# Patient Record
Sex: Male | Born: 1960 | Race: White | Hispanic: No | Marital: Married | Smoking: Never smoker
Health system: Southern US, Community
[De-identification: ages and names within clinical notes are randomized; demographics above are authoritative.]

## PROBLEM LIST (undated history)

## (undated) DIAGNOSIS — M1990 Unspecified osteoarthritis, unspecified site: Secondary | ICD-10-CM

## (undated) DIAGNOSIS — Z9289 Personal history of other medical treatment: Secondary | ICD-10-CM

## (undated) HISTORY — PX: KNEE SURGERY: SHX244

## (undated) HISTORY — PX: HERNIA REPAIR: SHX51

## (undated) HISTORY — PX: TONSILLECTOMY: SUR1361

---

## 2004-07-24 ENCOUNTER — Ambulatory Visit (HOSPITAL_COMMUNITY): Admission: RE | Admit: 2004-07-24 | Discharge: 2004-07-24 | Payer: Self-pay | Admitting: Orthopedic Surgery

## 2004-07-24 ENCOUNTER — Ambulatory Visit (HOSPITAL_BASED_OUTPATIENT_CLINIC_OR_DEPARTMENT_OTHER): Admission: RE | Admit: 2004-07-24 | Discharge: 2004-07-24 | Payer: Self-pay | Admitting: Orthopedic Surgery

## 2006-02-17 ENCOUNTER — Ambulatory Visit: Payer: Self-pay | Admitting: Specialist

## 2006-07-02 ENCOUNTER — Ambulatory Visit (HOSPITAL_BASED_OUTPATIENT_CLINIC_OR_DEPARTMENT_OTHER): Admission: RE | Admit: 2006-07-02 | Discharge: 2006-07-02 | Payer: Self-pay | Admitting: Orthopedic Surgery

## 2010-04-02 DIAGNOSIS — E559 Vitamin D deficiency, unspecified: Secondary | ICD-10-CM | POA: Insufficient documentation

## 2013-12-14 ENCOUNTER — Emergency Department: Payer: Self-pay | Admitting: Emergency Medicine

## 2013-12-14 LAB — CK TOTAL AND CKMB (NOT AT ARMC)
CK, Total: 98 U/L (ref 35–232)
CK-MB: 1.1 ng/mL (ref 0.5–3.6)

## 2013-12-14 LAB — COMPREHENSIVE METABOLIC PANEL
Albumin: 4.1 g/dL (ref 3.4–5.0)
Alkaline Phosphatase: 67 U/L
Anion Gap: 6 — ABNORMAL LOW (ref 7–16)
Bilirubin,Total: 0.9 mg/dL (ref 0.2–1.0)
Calcium, Total: 9.6 mg/dL (ref 8.5–10.1)
Chloride: 104 mmol/L (ref 98–107)
Co2: 26 mmol/L (ref 21–32)
Glucose: 89 mg/dL (ref 65–99)
Osmolality: 273 (ref 275–301)
Potassium: 4.7 mmol/L (ref 3.5–5.1)
SGOT(AST): 52 U/L — ABNORMAL HIGH (ref 15–37)
Sodium: 136 mmol/L (ref 136–145)
Total Protein: 7.7 g/dL (ref 6.4–8.2)

## 2013-12-14 LAB — CBC
HGB: 15.6 g/dL (ref 13.0–18.0)
MCHC: 34 g/dL (ref 32.0–36.0)
MCV: 85 fL (ref 80–100)
Platelet: 227 10*3/uL (ref 150–440)

## 2013-12-14 LAB — TROPONIN I
Troponin-I: <0.02 ng/mL
Troponin-I: <0.02 ng/mL

## 2013-12-21 ENCOUNTER — Ambulatory Visit: Payer: Self-pay | Admitting: Family Medicine

## 2014-04-25 LAB — LIPID PANEL
CHOLESTEROL: 186 mg/dL (ref 0–200)
HDL: 59 mg/dL (ref 35–70)
LDL CALC: 87 mg/dL
LDl/HDL Ratio: 1.5
Triglycerides: 199 mg/dL — AB (ref 40–160)

## 2014-04-25 LAB — HEPATIC FUNCTION PANEL
ALK PHOS: 58 U/L (ref 25–125)
ALT: 15 U/L (ref 10–40)
AST: 19 U/L (ref 14–40)
BILIRUBIN, TOTAL: 0.7 mg/dL

## 2014-04-25 LAB — BASIC METABOLIC PANEL
BUN: 13 mg/dL (ref 4–21)
CREATININE: 1 mg/dL (ref 0.6–1.3)
GLUCOSE: 102 mg/dL
POTASSIUM: 5 mmol/L (ref 3.4–5.3)
Sodium: 141 mmol/L (ref 137–147)

## 2014-04-25 LAB — CBC AND DIFFERENTIAL
HCT: 42 % (ref 41–53)
HEMOGLOBIN: 14.7 g/dL (ref 13.5–17.5)
Neutrophils Absolute: 6 /uL
Platelets: 218 10*3/uL (ref 150–399)
WBC: 7.9 10*3/mL

## 2014-04-25 LAB — TSH: TSH: 1.63 u[IU]/mL (ref 0.41–5.90)

## 2014-04-25 LAB — PSA: PSA: 0.4

## 2014-07-07 ENCOUNTER — Ambulatory Visit: Payer: Self-pay | Admitting: Unknown Physician Specialty

## 2014-07-10 HISTORY — PX: COLONOSCOPY: SHX174

## 2014-07-11 LAB — PATHOLOGY REPORT

## 2014-12-26 ENCOUNTER — Ambulatory Visit (INDEPENDENT_AMBULATORY_CARE_PROVIDER_SITE_OTHER): Payer: PRIVATE HEALTH INSURANCE | Admitting: Podiatry

## 2014-12-26 ENCOUNTER — Encounter: Payer: Self-pay | Admitting: Podiatry

## 2014-12-26 VITALS — BP 120/86 | HR 66 | Resp 16 | Ht 68.0 in | Wt 195.0 lb

## 2014-12-26 DIAGNOSIS — M79676 Pain in unspecified toe(s): Secondary | ICD-10-CM

## 2014-12-26 DIAGNOSIS — L600 Ingrowing nail: Secondary | ICD-10-CM

## 2014-12-26 MED ORDER — CEPHALEXIN 500 MG PO CAPS: 500.0000 mg | ORAL_CAPSULE | Freq: Three times a day (TID) | ORAL | Status: DC

## 2014-12-26 NOTE — Patient Instructions (Signed)

## 2014-12-26 NOTE — Progress Notes (Signed)
Subjective:    Patient ID: George HammansJohn A Hegstrom Sr., male    DOB: 1961-08-08, 54 y.o.   MRN: [id number]  HPI Comments: 54 year old male presents the office they with complaints of pain along the inside borders of both big toenails. He states that he previously had been treated for toenail fungus which has resolved however as the nails grew out he has noticed ingrown toenails and deformity to the nails. He states that he periodically will trim out the corner of the toe nail which bothers him. Or recently he has started to is increased pain overlying the area. He denies any redness or drainage from the nail sites. Denies any recent injury or trauma to bilateral lower extremities. No other complaints at this time.     Review of Systems  Gastrointestinal:       Urgency  Musculoskeletal:       Joint pain   Skin:       Change in nails  All other systems reviewed and are negative.      Objective:   Physical Exam AAO x3, NAD DP/PT pulses palpable bilaterally, CRT less than 3 seconds Protective sensation intact with Simms Weinstein monofilament, vibratory sensation intact, Achilles tendon reflex intact There is evidence of ingrowing along the medial nail borders of bilateral hallux. There is tenderness to palpation directly over this area. There is no drainage or purulence identified to bilateral hallux nail sites. No surrounding edema, erythema, increase in warmth over left hallux however there is trace erythema overlying the medial border of the right hallux nail. There is no ascending cellulitis identified. The remaining nails without pathology. No open lesions or pre-ulcerative lesions are identified. No pain with calf compression, swelling, warmth, erythema. No overlying edema, erythema, increase in warmth to bilateral lower extremity's. No areas of pinpoint bony tenderness. MMT 5/5, ROM WNL.       Assessment & Plan:  54 year old male with symptomatic bilateral medial hallux ingrown  toenail -Treatment options including conservative and surgical options were discussed the patient including alternatives, risks, complications. -At this time the patient has elected to proceed with chemical matricectomy partial nail avulsions of bilateral medial hallux nail borders. Risks and complications of the procedure were discussed the patient for which she understands and probably can since the procedure. Under sterile conditions a total of 2.5 mL of a one-to-one mixture of 2% lidocaine plain and 0.5% Marcaine plain was infiltrated in a hallux block fashion bilaterally. An additional 2 mL was infiltrated on the left to ensure anesthesia. Once anesthetized the skin was prepped in a sterile fashion. Tourniquets were applied. Next the medial nail borders of bilateral hallux were then sharply excised making sure to remove the entire offending nail border. There was found to be a very significant amount of ingrowing nail on bilateral hallux. No purulence was identified. Once the nail was removed the underlying skin was debrided and the skin was found to be intact. At this time phenol was applied under standard conditions and then copiously irrigated. Silvadene was applied followed by a dry sterile dressing. After application of the dressing the tourniquet was removed and there is found to be an immediate capillary refill time to both digits. Patient tolerated the procedure well without any complications. Post procedure instructions were discussed with the patient for which he verbally understood. Prescribed Keflex. Monitoring clinical signs or symptoms of infection and directed to call the office immediately should any occur or go directly to the emergency room. -Follow-up in one week  for nail check or sooner should any problems arise. In the meantime, encouraged to call the office with any questions, concerns, change in symptoms.

## 2014-12-27 ENCOUNTER — Telehealth: Payer: Self-pay | Admitting: *Deleted

## 2014-12-27 NOTE — Telephone Encounter (Signed)
Pt states he had a procedure and was instructed to soak in Epsom salt, but for how long.  I left a message informing the pt, he would soak for 20 minutes 2 times a day for 4 - 6 weeks or until the area got a dry hard scab, or until directed by the doctor.

## 2015-01-04 ENCOUNTER — Ambulatory Visit (INDEPENDENT_AMBULATORY_CARE_PROVIDER_SITE_OTHER): Payer: PRIVATE HEALTH INSURANCE | Admitting: Podiatry

## 2015-01-04 ENCOUNTER — Encounter: Payer: Self-pay | Admitting: Podiatry

## 2015-01-04 VITALS — BP 120/74 | HR 84 | Resp 16

## 2015-01-04 DIAGNOSIS — L600 Ingrowing nail: Secondary | ICD-10-CM

## 2015-01-04 NOTE — Progress Notes (Signed)
Patient ID: George HammansJohn A Mosqueda Sr., male   DOB: 1961-06-30, 54 y.o.   MRN: [id number]  Subjective: 54 year old male returns the office 9 days status post bilateral medial hallux partial nail avulsions with chemical matricectomy. He states that he's been continuing Epson salt soaks twice a day followed by antibiotic ointment and a Band-Aid at all times. He denies any drainage or any redness/streaking. Denies any systemic complaints such as fevers, chills, nausea, vomiting. He finish course of antibiotics tomorrow. No other complaints at this time in no acute changes since last appointment.  Objective: AAO x3, NAD DP/PT pulses palpable bilaterally, CRT less than 3 seconds Protective sensation intact with Simms Weinstein monofilament, vibratory sensation intact, Achilles tendon reflex intact Bilateral medial hallux status post partial nail avulsions which appear to be healing well this time. Small amount of granulation tissue within the procedure site. There isa thickened tenderness to palpation overlying the area. There is no swelling erythema, edema, drainage/purulence. No ascending cellulitis. No clinical signs of infection at this time. No other areas of tenderness to bilateral lower extremities. The pain with calf compression, swelling, warmth, erythema.  Assessment: 54 year old male 9 days status post bilateral medial hallux partial nail avulsions with chemical matricectomy, healing well  Plan: -Treatment options were discussed including alternatives, risks, complications. -At this time recommended the patient to continue soaking in Epson salt soaks twice a day covering with antibiotic ointment and a Band-Aid during the day. Can leave the area uncovered at night. Continue this until the area has completely healed. Monitor for any signs or symptoms of infection and directed to call the office should any occur. Finish course of antibiotics however he did not at the refills last needed and if he needs the  refills to call the office. -Discussed with the patient that if the area has not healed or symptomatic in 2 weeks to follow-up or sooner should any problems arise. Otherwise follow-up as needed. Encouraged to call the office with any questions, concerns, change in symptoms.

## 2015-01-04 NOTE — Patient Instructions (Signed)

## 2015-07-02 IMAGING — CR DG CHEST 2V
1 series · 2 of 2 positions shown · non-contrast
Comparison: None.

CLINICAL DATA: Chest pain

EXAM:
CHEST  2 VIEW

[Series 1: pa · 0.17mm/px · 2 of 2 slices shown]
[im 1/2]
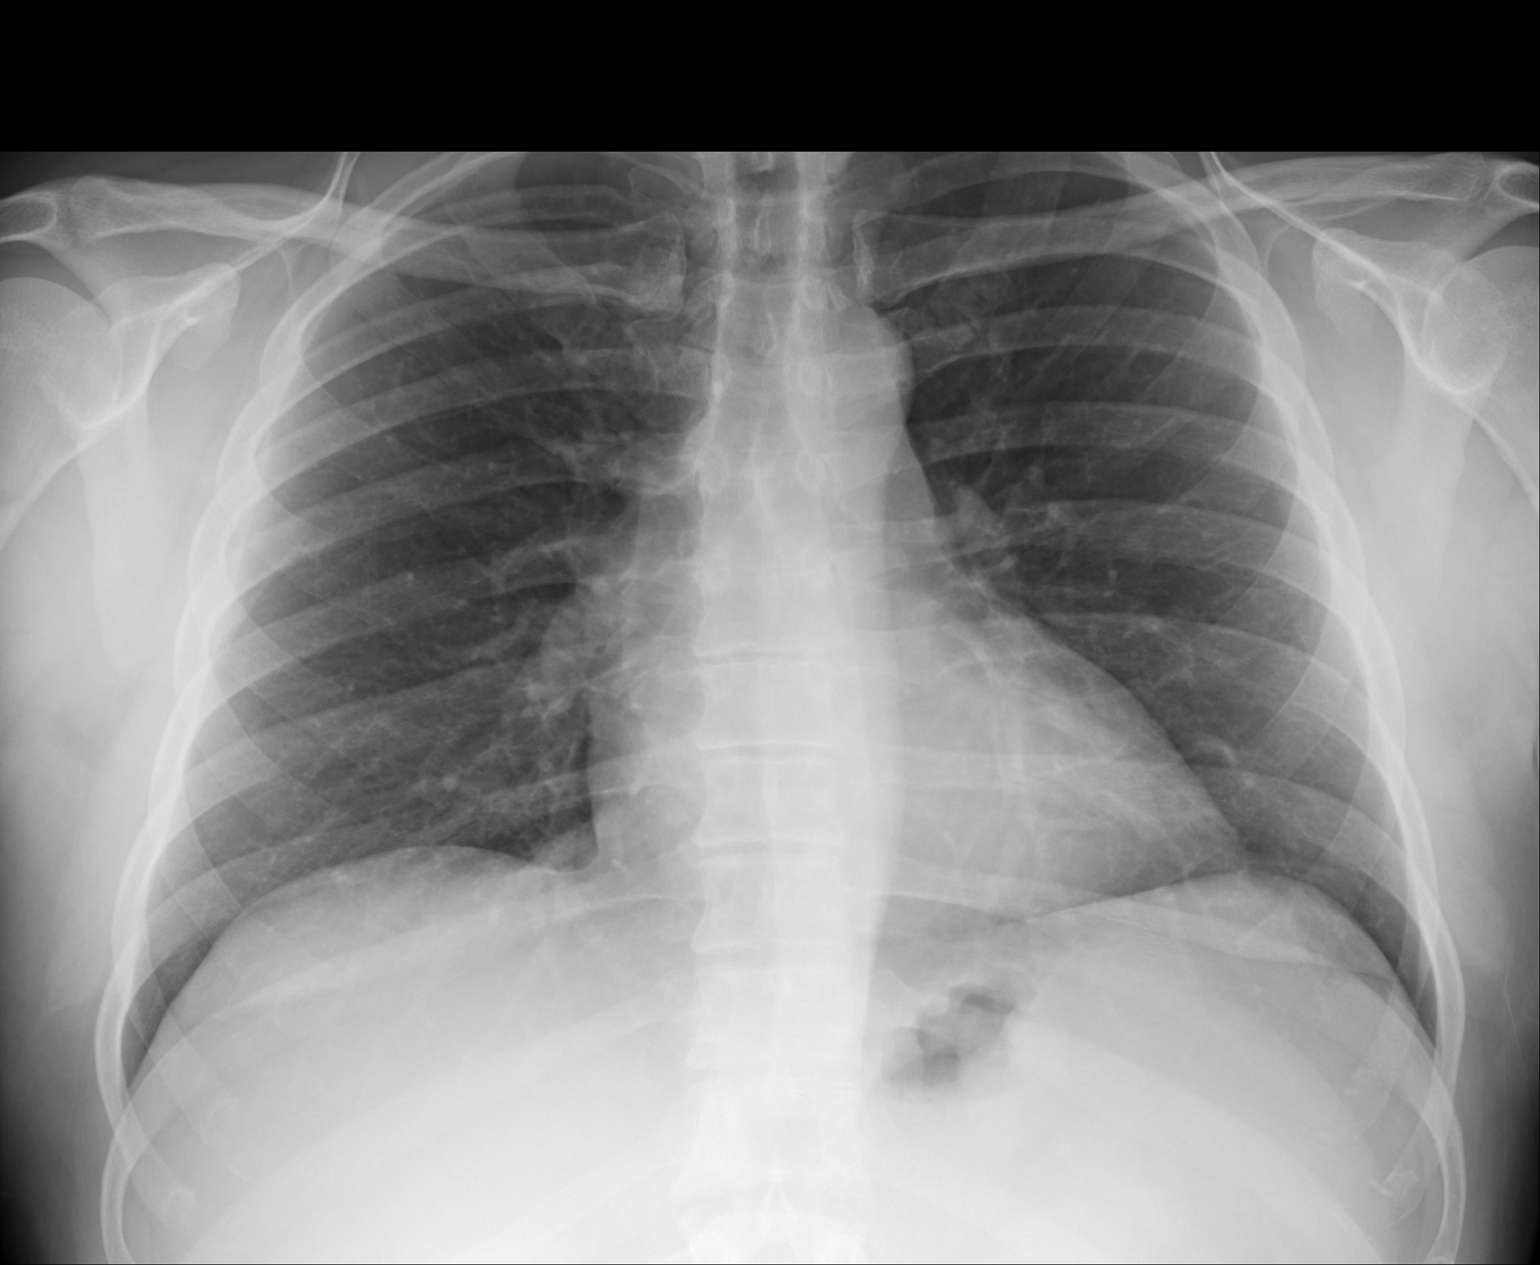
[im 2/2]
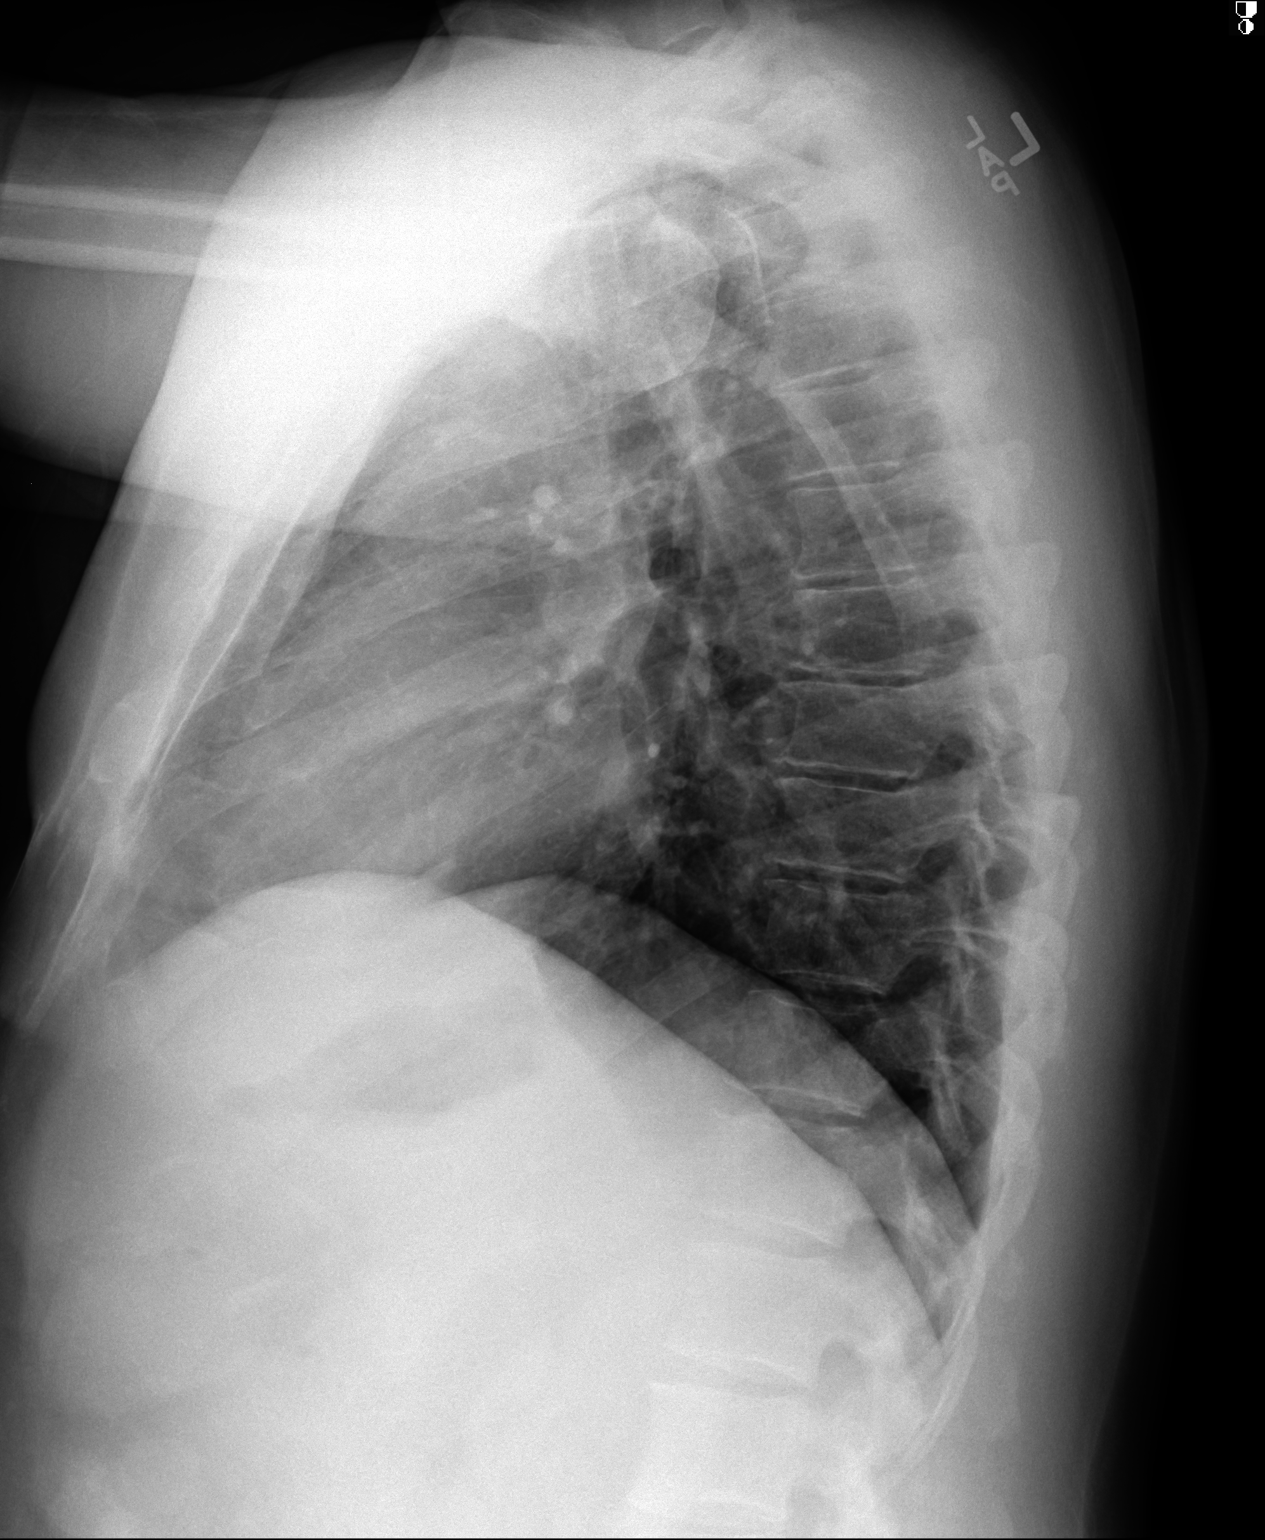

[2 of 2 positions shown; findings below may reference images not displayed]

FINDINGS: The heart size and mediastinal contours are within normal limits.
Both lungs are clear. The visualized skeletal structures are
unremarkable.
IMPRESSION: No active cardiopulmonary disease.

## 2016-06-06 ENCOUNTER — Other Ambulatory Visit: Payer: Self-pay | Admitting: Physician Assistant

## 2016-06-06 NOTE — H&P (Signed)
TOTAL KNEE ADMISSION H&P  Patient is being admitted for left total knee arthroplasty.  Subjective:  Chief Complaint:left knee pain.  HPI: George HammansJohn A Gripp Sr., 55 y.o. male, has a history of pain and functional disability in the left knee due to arthritis and has failed non-surgical conservative treatments for greater than 12 weeks to includeNSAID's and/or analgesics and corticosteriod injections.  Onset of symptoms was gradual, starting 1 years ago with stable course since that time. The patient noted prior procedures on the knee to include  arthroscopy, menisectomy and ACL reconstruction on the left knee(s).  Patient currently rates pain in the left knee(s) at 3 out of 10 with activity. Patient has pain that interferes with activities of daily living and crepitus.  Patient has evidence of subchondral sclerosis and joint space narrowing by imaging studies. There is no active infection.  There are no active problems to display for this patient.  No past medical history on file.  No past surgical history on file.   (Not in a hospital admission) No Known Allergies  Social History  Substance Use Topics  . Smoking status: Never Smoker   . Smokeless tobacco: Not on file  . Alcohol Use: 0.0 oz/week    0 Standard drinks or equivalent per week    No family history on file.   Review of Systems  Constitutional: Negative.   HENT: Negative.   Eyes: Negative.   Respiratory: Negative.   Cardiovascular: Negative.   Gastrointestinal: Negative.   Genitourinary: Negative.   Musculoskeletal: Positive for joint pain.  Skin: Negative.   Neurological: Negative.   Endo/Heme/Allergies: Negative.   Psychiatric/Behavioral: Negative.     Objective:  Physical Exam  Constitutional: He is oriented to person, place, and time. He appears well-developed and well-nourished.  HENT:  Head: Normocephalic and atraumatic.  Eyes: EOM are normal. Pupils are equal, round, and reactive to light.  Neck: Normal range  of motion. Neck supple.  Cardiovascular: Normal rate and regular rhythm.   Respiratory: Effort normal and breath sounds normal.  GI: Soft. Bowel sounds are normal.  Musculoskeletal:  Left knee range of motion 0-95.  Marked patellofemoral crepitus.  Ligaments stable.  Neurological: He is alert and oriented to person, place, and time.  Skin: Skin is warm and dry.  Psychiatric: He has a normal mood and affect. His behavior is normal. Judgment and thought content normal.    Vital signs in last 24 hours: @VSRANGES @  Labs:   Estimated body mass index is 29.66 kg/(m^2) as calculated from the following:   Height as of 12/26/14: 5\' 8"  (1.727 m).   Weight as of 12/26/14: 88.451 kg (195 lb).   Imaging Review Plain radiographs demonstrate severe degenerative joint disease of the left knee(s). The overall alignment ismild varus. The bone quality appears to be fair for age and reported activity level.  Assessment/Plan:  End stage arthritis, left knee   The patient history, physical examination, clinical judgment of the provider and imaging studies are consistent with end stage degenerative joint disease of the left knee(s) and total knee arthroplasty is deemed medically necessary. The treatment options including medical management, injection therapy arthroscopy and arthroplasty were discussed at length. The risks and benefits of total knee arthroplasty were presented and reviewed. The risks due to aseptic loosening, infection, stiffness, patella tracking problems, thromboembolic complications and other imponderables were discussed. The patient acknowledged the explanation, agreed to proceed with the plan and consent was signed. Patient is being admitted for inpatient treatment for surgery, pain  control, PT, OT, prophylactic antibiotics, VTE prophylaxis, progressive ambulation and ADL's and discharge planning. The patient is planning to be discharged home with home health services

## 2016-06-09 ENCOUNTER — Telehealth: Payer: Self-pay

## 2016-06-09 NOTE — Telephone Encounter (Signed)
lmtcb- patient needs appointment for surgical clearance, form on desk [phone]

## 2016-06-10 DIAGNOSIS — K219 Gastro-esophageal reflux disease without esophagitis: Secondary | ICD-10-CM | POA: Insufficient documentation

## 2016-06-10 DIAGNOSIS — L309 Dermatitis, unspecified: Secondary | ICD-10-CM | POA: Insufficient documentation

## 2016-06-10 NOTE — Telephone Encounter (Signed)
Faxed to Dr. Eulah PontMurphy that patient needs to be seen for surgical clearance and that we have not heard from patient back yet-aa

## 2016-06-10 NOTE — Telephone Encounter (Signed)
Pt made appt for tomorrow wed 06/11/16-aa

## 2016-06-11 ENCOUNTER — Encounter: Payer: Self-pay | Admitting: Family Medicine

## 2016-06-11 ENCOUNTER — Ambulatory Visit (INDEPENDENT_AMBULATORY_CARE_PROVIDER_SITE_OTHER): Payer: PRIVATE HEALTH INSURANCE | Admitting: Family Medicine

## 2016-06-11 VITALS — BP 126/78 | HR 62 | Temp 98.1°F | Resp 12 | Ht 67.5 in | Wt 199.2 lb

## 2016-06-11 DIAGNOSIS — E7800 Pure hypercholesterolemia, unspecified: Secondary | ICD-10-CM

## 2016-06-11 DIAGNOSIS — Z01818 Encounter for other preprocedural examination: Secondary | ICD-10-CM

## 2016-06-11 NOTE — Progress Notes (Signed)
Subjective:  HPI  Patient is here for surgical clearance. He is having total left knee replacement on July 12th. Patient has pain in the left knee and has hard time walking. He does not smoke, no illegal drugs. No aspirin use. He has 2 to 3 drinks of alcohol a week. Patient does not exercise due to the knee issue. [facility] labs. We discussed at some length how the pain is affecting his day-to-day life. Prior to Admission medications   Not on File    Patient Active Problem List   Diagnosis Date Noted  . Dermatitis, eczematoid 06/10/2016  . Acid reflux 06/10/2016  . Avitaminosis D 04/02/2010  . Pure hypercholesterolemia 12/21/2009    History reviewed. No pertinent past medical history.  Social History   Social History  . Marital Status: Married    Spouse Name: N/A  . Number of Children: N/A  . Years of Education: N/A   Occupational History  . Not on file.   Social History Main Topics  . Smoking status: Never Smoker   . Smokeless tobacco: Never Used  . Alcohol Use: 0.0 oz/week    0 Standard drinks or equivalent per week     Comment: 2 to 3 drinks a week.  . Drug Use: No  . Sexual Activity: Not on file   Other Topics Concern  . Not on file   Social History Narrative    No Known Allergies  Review of Systems  Constitutional: Negative.   Eyes: Negative.   Respiratory: Negative.   Cardiovascular: Negative.   Gastrointestinal: Negative.   Musculoskeletal: Positive for joint pain.  Skin: Negative.   Neurological: Negative.   Psychiatric/Behavioral: Negative.     Immunization History  Administered Date(s) Administered  . Tdap 03/02/2006   Objective:  There were no vitals taken for this visit.  Physical Exam  Constitutional: He is oriented to person, place, and time and well-developed, well-nourished, and in no distress.  HENT:  Head: Normocephalic and atraumatic.  Right Ear: External ear  normal.  Left Ear: External ear normal.  Nose: Nose normal.  Eyes: Conjunctivae are normal.  Cardiovascular: Normal rate, regular rhythm and normal heart sounds.   Pulmonary/Chest: Effort normal and breath sounds normal.  Abdominal: Soft.  Neurological: He is alert and oriented to person, place, and time.  Skin: Skin is warm and dry.  Psychiatric: Mood, memory, affect and judgment normal.    Lab Results  Component Value Date   WBC 7.9 04/25/2014   HGB 14.7 04/25/2014   HCT 42 04/25/2014   PLT 218 04/25/2014   GLUCOSE 89 12/14/2013   CHOL 186 04/25/2014   TRIG 199* 04/25/2014   HDL 59 04/25/2014   LDLCALC 87 04/25/2014   TSH 1.63 04/25/2014   PSA 0.4 04/25/2014    CMP     Component Value Date/Time   NA 141 04/25/2014   NA 136 12/14/2013 1212   K 5.0 04/25/2014   K 4.7 12/14/2013 1212   CL 104 12/14/2013 1212   CO2 26 12/14/2013 1212   GLUCOSE 89 12/14/2013 1212   BUN 13 04/25/2014   BUN 17 12/14/2013 1212   CREATININE 1.0 04/25/2014   CREATININE 0.77 12/14/2013 1212   CALCIUM 9.6 12/14/2013 1212   PROT 7.7 12/14/2013 1212   ALBUMIN 4.1 12/14/2013 1212   AST 19 04/25/2014   AST 52* 12/14/2013 1212   ALT 15 04/25/2014   ALT 31 12/14/2013  1212   ALKPHOS 58 04/25/2014   ALKPHOS 67 12/14/2013 1212   BILITOT 0.9 12/14/2013 1212   GFRNONAA >60 12/14/2013 1212   GFRAA >60 12/14/2013 1212    Assessment and Plan :  1. Pre-op exam Patient medically stable for new replacement. He is systems essentially negative except for the knee pain which is having him from doing any activities. - EKG 12-Lead Patient medically clear for left TKR. 2. Pure hypercholesterolemia 3.severe OA left knee More than 50% of this visit is spent in counseling  Julieanne Mansonichard Gilbert MD [facility] 06/11/2016 3:25 PM

## 2016-06-12 ENCOUNTER — Encounter (HOSPITAL_COMMUNITY): Payer: Self-pay

## 2016-06-12 ENCOUNTER — Encounter (HOSPITAL_COMMUNITY)
Admission: RE | Admit: 2016-06-12 | Discharge: 2016-06-12 | Disposition: A | Discharge status: Home / Self Care | Payer: No Typology Code available for payment source | Source: Ambulatory Visit | Attending: Orthopedic Surgery | Admitting: Orthopedic Surgery

## 2016-06-12 DIAGNOSIS — Z01812 Encounter for preprocedural laboratory examination: Secondary | ICD-10-CM | POA: Insufficient documentation

## 2016-06-12 DIAGNOSIS — Z0183 Encounter for blood typing: Secondary | ICD-10-CM | POA: Insufficient documentation

## 2016-06-12 DIAGNOSIS — M1712 Unilateral primary osteoarthritis, left knee: Secondary | ICD-10-CM | POA: Diagnosis not present

## 2016-06-12 DIAGNOSIS — Z01818 Encounter for other preprocedural examination: Secondary | ICD-10-CM | POA: Diagnosis not present

## 2016-06-12 HISTORY — DX: Personal history of other medical treatment: Z92.89

## 2016-06-12 HISTORY — DX: Unspecified osteoarthritis, unspecified site: M19.90

## 2016-06-12 LAB — CBC
HCT: 46.7 % (ref 39.0–52.0)
HEMOGLOBIN: 15.9 g/dL (ref 13.0–17.0)
MCH: 29.2 pg (ref 26.0–34.0)
MCHC: 34 g/dL (ref 30.0–36.0)
MCV: 85.8 fL (ref 78.0–100.0)
PLATELETS: 242 10*3/uL (ref 150–400)
RBC: 5.44 MIL/uL (ref 4.22–5.81)
RDW: 13.1 % (ref 11.5–15.5)
WBC: 9.4 10*3/uL (ref 4.0–10.5)

## 2016-06-12 LAB — BASIC METABOLIC PANEL
ANION GAP: 6 (ref 5–15)
BUN: 14 mg/dL (ref 6–20)
CALCIUM: 10 mg/dL (ref 8.9–10.3)
CO2: 26 mmol/L (ref 22–32)
CREATININE: 0.95 mg/dL (ref 0.61–1.24)
Chloride: 107 mmol/L (ref 101–111)
GFR calc Af Amer: >60 mL/min (ref >60)
GLUCOSE: 100 mg/dL — AB (ref 65–99)
Potassium: 4.5 mmol/L (ref 3.5–5.1)
Sodium: 139 mmol/L (ref 135–145)

## 2016-06-12 LAB — SURGICAL PCR SCREEN
MRSA, PCR: NEGATIVE
STAPHYLOCOCCUS AUREUS: NEGATIVE

## 2016-06-12 LAB — ABO/RH: ABO/RH(D): O POS

## 2016-06-12 LAB — TYPE AND SCREEN
ABO/RH(D): O POS
ANTIBODY SCREEN: NEGATIVE

## 2016-06-12 NOTE — Progress Notes (Signed)
Pt. Seen by PCP yesterday, cleared for surgery. EKG done at that visit.  Pt. C/o shoulder pain that was not normal for him a yr. plus ago, [facility], request for that document [facility].

## 2016-06-12 NOTE — Pre-Procedure Instructions (Signed)
Judeen HammansJohn A Garn Sr.  06/12/2016      [place] - , [place] - [id number] [facility] [address] [place] [address] Phone: [phone] Fax: [phone]    Your procedure is scheduled on 06/25/2016.  [facility] Ad[facility] at 1:30 P.M.  Call this number if you have problems the morning of surgery:  [phone]   Remember:  Do not eat food or drink liquids after midnight.  On TUESDAY   Take these medicines the morning of surgery with A SIP OF WATER : NOTHING   Do not wear jewelry, make-up or nail polish.   Do not wear lotions, powders, or perfumes.  You may wear deoderant.    Men may shave face and neck.   Do not bring valuables to the hospital.   [facility] is not responsible for any belongings or valuables.  Contacts, dentures or bridgework may not be worn into surgery.  Leave your suitcase in the car.  After surgery it may be brought to your room.  For patients admitted to the hospital, discharge time will be determined by your treatment team.  Patients discharged the day of surgery will not be allowed to drive home.   Name and phone number of your driver:   With wife  Special instructions:  Special Instructions: [place] - Preparing for Surgery  Before surgery, you can play an important role.  Because skin is not sterile, your skin needs to be as free of germs as possible.  You can reduce the number of germs on you skin by washing with CHG (chlorahexidine gluconate) soap before surgery.  CHG is an antiseptic cleaner which kills germs and bonds with the skin to continue killing germs even after washing.  Please DO NOT use if you have an allergy to CHG or antibacterial soaps.  If your skin becomes reddened/irritated stop using the CHG and inform your nurse when you arrive at Short Stay.  Do not shave (including legs and underarms) for at least 48 hours prior to the first CHG shower.  You may shave your  face.  Please follow these instructions carefully:   1.  Shower with CHG Soap the night before surgery and the  morning of Surgery.  2.  If you choose to wash your hair, wash your hair first as usual with your  normal shampoo.  3.  After you shampoo, rinse your hair and body thoroughly to remove the  Shampoo.  4.  Use CHG as you would any other liquid soap.  You can apply chg directly to the skin and wash gently with scrungie or a clean washcloth.  5.  Apply the CHG Soap to your body ONLY FROM THE NECK DOWN.    Do not use on open wounds or open sores.  Avoid contact with your eyes, ears, mouth and genitals (private parts).  Wash genitals (private parts)   with your normal soap.  6.  Wash thoroughly, paying special attention to the area where your surgery will be performed.  7.  Thoroughly rinse your body with warm water from the neck down.  8.  DO NOT shower/wash with your normal soap after using and rinsing off   the CHG Soap.  9.  Pat yourself dry with a clean towel.            10.  Wear clean pajamas.            11.  Place  clean sheets on your bed the night of your first shower and do not sleep with pets.  Day of Surgery  Do not apply any lotions/deodorants the morning of surgery.  Please wear clean clothes to the hospital/surgery center.  Please read over the following fact sheets that you were given. Pain Booklet, Coughing and Deep Breathing, Total Joint Packet, MRSA Information and Surgical Site Infection Prevention

## 2016-06-13 ENCOUNTER — Encounter (HOSPITAL_COMMUNITY): Payer: Self-pay | Admitting: Emergency Medicine

## 2016-06-13 NOTE — Progress Notes (Addendum)
Anesthesia Chart Review:  Pt is a [age] year old male scheduled for L total knee arthroplasty on 06/25/2016 with Mckinley Jewelaniel Murphy, MD.   PCP is Julieanne Mansonichard Gilbert, MD who has cleared pt for surgery.   PMH includes:  Arthritis. Never smoker. BMI 31  Preoperative labs reviewed.    EKG 06/11/16: Sinus  Rhythm. Low voltage in precordial leads. Poor R-wave progression -may be secondary to pulmonary disease, consider old anterior infarct.   Stress echo 12/16/13 [facility] Potters([facility]):  1. Normal treadmill EKG without evidence of ischemia or dysrhythmia 2. Excellent exercise tolerance for age 89. Normal stress echocardiographic images without evidence of ischemia  If no changes, I anticipate pt can proceed with surgery as scheduled.   Rica Mastngela Dejia Ebron, FNP-BC [facility]/Anesthesiology Phone: [phone] 06/16/2016 2:32 PM

## 2016-06-25 ENCOUNTER — Encounter (HOSPITAL_COMMUNITY): Admission: RE | Payer: Self-pay | Source: Ambulatory Visit

## 2016-06-25 ENCOUNTER — Inpatient Hospital Stay (HOSPITAL_COMMUNITY)
Admission: RE | Admit: 2016-06-25 | Payer: No Typology Code available for payment source | Source: Ambulatory Visit | Admitting: Orthopedic Surgery

## 2016-06-25 SURGERY — ARTHROPLASTY, KNEE, TOTAL
Anesthesia: General | Site: Knee | Laterality: Left

## 2017-12-24 LAB — HM COLONOSCOPY

## 2018-01-05 ENCOUNTER — Encounter: Payer: Self-pay | Admitting: Unknown Physician Specialty

## 2018-03-17 ENCOUNTER — Other Ambulatory Visit: Payer: Self-pay

## 2018-03-17 ENCOUNTER — Ambulatory Visit: Payer: No Typology Code available for payment source | Admitting: Family Medicine

## 2018-03-17 VITALS — BP 122/70 | HR 90 | Temp 98.0°F | Resp 16 | Wt 200.0 lb

## 2018-03-17 DIAGNOSIS — J329 Chronic sinusitis, unspecified: Secondary | ICD-10-CM

## 2018-03-17 DIAGNOSIS — J42 Unspecified chronic bronchitis: Secondary | ICD-10-CM | POA: Diagnosis not present

## 2018-03-17 MED ORDER — AMOXICILLIN-POT CLAVULANATE 875-125 MG PO TABS: 1.0000 | ORAL_TABLET | Freq: Three times a day (TID) | ORAL | 0 refills | Status: DC

## 2018-03-17 NOTE — Progress Notes (Signed)
George HammansJohn A Toohey Sr.  MRN: [id number] DOB: 04-Feb-1961  Subjective:  HPI   The patient is a 57 year old male who presents for evaluation of cough and congestion.  He states he started having chills about 1 week ago and then it turned into laryngitis with chest congestion and cough productive of greenish sputum and wheezing at night.   He has been using Mucinex DM and states he is now producing more mucus that he is able to move.   He also states that he had been at a meeting last week and was around a lot of people that were sick and then had to fly to [place] an outdoor [place] in the [place].   He is not being kept up from the cough but is getting up at night when he wakes and goes into the bathroom and tries to get as much congestion out as he can.  Patient Active Problem List   Diagnosis Date Noted  . Dermatitis, eczematoid 06/10/2016  . Acid reflux 06/10/2016  . Avitaminosis D 04/02/2010  . Pure hypercholesterolemia 12/21/2009    Past Medical History:  Diagnosis Date  . Arthritis    L knee, has had treatment for steroids, for lumbar disc problem- 2016   . H/O exercise stress test 2014-   normal , [facility]    Social History   Socioeconomic History  . Marital status: Married    Spouse name: Not on file  . Number of children: Not on file  . Years of education: Not on file  . Highest education level: Not on file  Occupational History  . Not on file  Social Needs  . Financial resource strain: Not on file  . Food insecurity:    Worry: Not on file    Inability: Not on file  . Transportation needs:    Medical: Not on file    Non-medical: Not on file  Tobacco Use  . Smoking status: Never Smoker  . Smokeless tobacco: Never Used  Substance and Sexual Activity  . Alcohol use: Yes    Alcohol/week: 0.0 oz    Comment: 2 to 3 drinks a week.  . Drug use: No  . Sexual activity: Not on file  Lifestyle  . Physical activity:    Days per week: Not on file   Minutes per session: Not on file  . Stress: Not on file  Relationships  . Social connections:    Talks on phone: Not on file    Gets together: Not on file    Attends religious service: Not on file    Active member of club or organization: Not on file    Attends meetings of clubs or organizations: Not on file    Relationship status: Not on file  . Intimate partner violence:    Fear of current or ex partner: Not on file    Emotionally abused: Not on file    Physically abused: Not on file    Forced sexual activity: Not on file  Other Topics Concern  . Not on file  Social History Narrative  . Not on file    Outpatient Encounter Medications as of 03/17/2018  Medication Sig  . ibuprofen (ADVIL,MOTRIN) 200 MG tablet Take 800 mg by mouth every 6 (six) hours as needed.   No facility-administered encounter medications on file as of 03/17/2018.     No Known Allergies  Review of Systems  Constitutional: Positive for chills and malaise/fatigue. Negative for fever.  HENT: Positive for congestion. Negative for ear discharge, ear pain, hearing loss, nosebleeds (some blood in his head congestion that he thinks is from PND), sinus pain, sore throat and tinnitus.   Eyes: Negative for blurred vision, double vision, photophobia, pain, discharge and redness.  Respiratory: Positive for cough, sputum production (greenish) and wheezing. Negative for shortness of breath.   Cardiovascular: Negative for chest pain, palpitations, orthopnea, claudication and leg swelling.  Gastrointestinal: Negative.   Skin: Negative.   Endo/Heme/Allergies: Negative.   Psychiatric/Behavioral: Negative.     Objective:  BP 122/70 (BP Location: Right Arm, Patient Position: Sitting, Cuff Size: Normal)   Pulse 90   Temp 98 F (36.7 C) (Oral)   Resp 16   Wt 200 lb (90.7 kg)   BMI 31.09 kg/m   Physical Exam  Constitutional: He is oriented to person, place, and time and well-developed, well-nourished, and in no distress.    HENT:  Head: Normocephalic and atraumatic.  Right Ear: External ear normal.  Left Ear: External ear normal.  Nose: Nose normal.  Mouth/Throat: Oropharynx is clear and moist.  Eyes: Conjunctivae are normal. No scleral icterus.  Neck: No thyromegaly present.  Cardiovascular: Normal rate, regular rhythm and normal heart sounds.  Pulmonary/Chest: Effort normal and breath sounds normal.  Neurological: He is alert and oriented to person, place, and time. Gait normal. GCS score is 15.  Skin: Skin is warm and dry.  Psychiatric: Mood, memory, affect and judgment normal.    Assessment and Plan :  1. Chronic bronchitis, unspecified chronic bronchitis type (HCC)  - amoxicillin-clavulanate (AUGMENTIN) 875-125 MG tablet; Take 1 tablet by mouth 3 (three) times daily.  Dispense: 21 tablet; Refill: 0  2. Sinusitis, unspecified chronicity, unspecified location  - amoxicillin-clavulanate (AUGMENTIN) 875-125 MG tablet; Take 1 tablet by mouth 3 (three) times daily.  Dispense: 21 tablet; Refill: 0  CPE this summer.  I have done the exam and reviewed the chart and it is accurate to the best of my knowledge. Dentist has been used and  any errors in dictation or transcription are unintentional. Julieanne Manson M.D. [facility]

## 2018-07-08 ENCOUNTER — Ambulatory Visit (INDEPENDENT_AMBULATORY_CARE_PROVIDER_SITE_OTHER): Payer: No Typology Code available for payment source | Admitting: Family Medicine

## 2018-07-08 VITALS — BP 122/78 | HR 84 | Temp 97.9°F | Resp 16 | Ht 67.5 in | Wt 200.0 lb

## 2018-07-08 DIAGNOSIS — Z125 Encounter for screening for malignant neoplasm of prostate: Secondary | ICD-10-CM

## 2018-07-08 DIAGNOSIS — H16139 Photokeratitis, unspecified eye: Secondary | ICD-10-CM

## 2018-07-08 DIAGNOSIS — Z0000 Encounter for general adult medical examination without abnormal findings: Secondary | ICD-10-CM | POA: Diagnosis not present

## 2018-07-08 LAB — POCT URINALYSIS DIPSTICK
Bilirubin, UA: NEGATIVE
GLUCOSE UA: NEGATIVE
Ketones, UA: NEGATIVE
LEUKOCYTES UA: NEGATIVE
Nitrite, UA: NEGATIVE
Protein, UA: NEGATIVE
RBC UA: NEGATIVE
Spec Grav, UA: 1.02 (ref 1.010–1.025)
Urobilinogen, UA: 0.2 E.U./dL
pH, UA: 6 (ref 5.0–8.0)

## 2018-07-08 NOTE — Progress Notes (Signed)
Patient: George Bhakta., Male    DOB: [birth year], 57 y.o.   MRN: [id number] Visit Date: 07/08/2018  Today's Provider: Megan Mans, MD   Chief Complaint  Patient presents with  . Annual Exam   Subjective:  George Logan Sr. is a 57 y.o. male who presents today for health maintenance and complete physical. He feels well. He reports exercising weekly. He reports he is sleeping well.He is married,the father of 3 sons and has an 38 month old granddaughter.  12/24/17 Colonoscopy  Review of Systems  Constitutional: Negative.   HENT: Negative.   Eyes: Negative.   Respiratory: Negative.   Cardiovascular: Negative.   Gastrointestinal: Negative.   Endocrine: Negative.   Genitourinary: Negative.   Musculoskeletal: Negative.   Skin: Negative.   Allergic/Immunologic: Negative.   Neurological: Negative.   Hematological: Negative.   Psychiatric/Behavioral: Negative.     Social History   Socioeconomic History  . Marital status: Married    Spouse name: Not on file  . Number of children: Not on file  . Years of education: Not on file  . Highest education level: Not on file  Occupational History  . Not on file  Social Needs  . Financial resource strain: Not on file  . Food insecurity:    Worry: Not on file    Inability: Not on file  . Transportation needs:    Medical: Not on file    Non-medical: Not on file  Tobacco Use  . Smoking status: Never Smoker  . Smokeless tobacco: Never Used  Substance and Sexual Activity  . Alcohol use: Yes    Alcohol/week: 0.0 oz    Comment: 2 to 3 drinks a week.  . Drug use: No  . Sexual activity: Not on file  Lifestyle  . Physical activity:    Days per week: Not on file    Minutes per session: Not on file  . Stress: Not on file  Relationships  . Social connections:    Talks on phone: Not on file    Gets together: Not on file    Attends religious service: Not on file    Active member of club or organization: Not on file    Attends  meetings of clubs or organizations: Not on file    Relationship status: Not on file  . Intimate partner violence:    Fear of current or ex partner: Not on file    Emotionally abused: Not on file    Physically abused: Not on file    Forced sexual activity: Not on file  Other Topics Concern  . Not on file  Social History Narrative  . Not on file    Patient Active Problem List   Diagnosis Date Noted  . Dermatitis, eczematoid 06/10/2016  . Acid reflux 06/10/2016  . Avitaminosis D 04/02/2010  . Pure hypercholesterolemia 12/21/2009    Past Surgical History:  Procedure Laterality Date  . COLONOSCOPY  07/10/14   tubular adenoma and hyperplastic polyps repeat in 3 years  . HERNIA REPAIR     age 66, L inguinal   . KNEE SURGERY Left   . TONSILLECTOMY      His family history includes Diabetes in his father; Hyperlipidemia in his father; Hypertension in his brother, father, and mother; Stroke in his father.     Outpatient Encounter Medications as of 07/08/2018  Medication Sig  . ibuprofen (ADVIL,MOTRIN) 200 MG tablet Take 800 mg by mouth every 6 (six) hours as needed.  . [  DISCONTINUED] amoxicillin-clavulanate (AUGMENTIN) 875-125 MG tablet Take 1 tablet by mouth 3 (three) times daily.   No facility-administered encounter medications on file as of 07/08/2018.     Patient Care Team: Maple HudsonGilbert, Richard L Jr., MD as PCP - General (Family Medicine)      Objective:   Vitals:  Vitals:   07/08/18 0837  BP: 122/78  Pulse: 84  Resp: 16  Temp: 97.9 F (36.6 C)  TempSrc: Oral  SpO2: 96%  Weight: 200 lb (90.7 kg)  Height: 5' 7.5" (1.715 m)    Physical Exam  Constitutional: He is oriented to person, place, and time. He appears well-developed and well-nourished.  HENT:  Head: Normocephalic and atraumatic.  Right Ear: External ear normal.  Left Ear: External ear normal.  Nose: Nose normal.  Mouth/Throat: Oropharynx is clear and moist.  Eyes: Pupils are equal, round, and reactive to  light. Conjunctivae and EOM are normal.  Neck: Normal range of motion. Neck supple.  Cardiovascular: Normal rate, regular rhythm, normal heart sounds and intact distal pulses.  Pulmonary/Chest: Effort normal and breath sounds normal.  Abdominal: Soft. Bowel sounds are normal.  Genitourinary: Rectum normal, prostate normal and penis normal.  Musculoskeletal: Normal range of motion.  Neurological: He is oriented to person, place, and time.  Skin: Skin is warm and dry.  Psychiatric: He has a normal mood and affect. His behavior is normal. Judgment and thought content normal.     Depression Screen PHQ 2/9 Scores 07/08/2018 03/17/2018 06/11/2016  PHQ - 2 Score 0 0 0      Assessment & Plan:     Routine Health Maintenance and Physical Exam  Exercise Activities and Dietary recommendations Goals    None      Immunization History  Administered Date(s) Administered  . Tdap 03/02/2006    Health Maintenance  Topic Date Due  . HIV Screening  11/01/1976  . TETANUS/TDAP  03/02/2016  . INFLUENZA VACCINE  07/15/2018  . COLONOSCOPY  12/24/2020  . Hepatitis C Screening  Completed     Discussed health benefits of physical activity, and encouraged him to engage in regular exercise appropriate for his age and condition.  AKs Refer to dermatology.   I have done the exam and reviewed the chart and it is accurate to the best of my knowledge. DentistDragon  technology has been used and  any errors in dictation or transcription are unintentional. Julieanne Mansonichard Gilbert M.D. [facility]

## 2018-07-09 LAB — COMPREHENSIVE METABOLIC PANEL
A/G RATIO: 2 (ref 1.2–2.2)
ALBUMIN: 4.7 g/dL (ref 3.5–5.5)
ALT: 15 IU/L (ref 0–44)
AST: 19 IU/L (ref 0–40)
Alkaline Phosphatase: 64 IU/L (ref 39–117)
BILIRUBIN TOTAL: 0.8 mg/dL (ref 0.0–1.2)
BUN / CREAT RATIO: 15 (ref 9–20)
BUN: 14 mg/dL (ref 6–24)
CHLORIDE: 101 mmol/L (ref 96–106)
CO2: 23 mmol/L (ref 20–29)
Calcium: 9.7 mg/dL (ref 8.7–10.2)
Creatinine, Ser: 0.96 mg/dL (ref 0.76–1.27)
GFR calc non Af Amer: 88 mL/min/{1.73_m2} (ref >59)
GFR, EST AFRICAN AMERICAN: 102 mL/min/{1.73_m2} (ref >59)
GLOBULIN, TOTAL: 2.4 g/dL (ref 1.5–4.5)
Glucose: 111 mg/dL — ABNORMAL HIGH (ref 65–99)
POTASSIUM: 4.4 mmol/L (ref 3.5–5.2)
Sodium: 139 mmol/L (ref 134–144)
TOTAL PROTEIN: 7.1 g/dL (ref 6.0–8.5)

## 2018-07-09 LAB — CBC WITH DIFFERENTIAL/PLATELET
Basophils Absolute: 0.1 10*3/uL (ref 0.0–0.2)
Basos: 1 %
EOS (ABSOLUTE): 0.1 10*3/uL (ref 0.0–0.4)
Eos: 2 %
HEMOGLOBIN: 15.7 g/dL (ref 13.0–17.7)
Hematocrit: 46.7 % (ref 37.5–51.0)
IMMATURE GRANS (ABS): 0.1 10*3/uL (ref 0.0–0.1)
Immature Granulocytes: 1 %
LYMPHS: 15 %
Lymphocytes Absolute: 1 10*3/uL (ref 0.7–3.1)
MCH: 29.2 pg (ref 26.6–33.0)
MCHC: 33.6 g/dL (ref 31.5–35.7)
MCV: 87 fL (ref 79–97)
Monocytes Absolute: 0.6 10*3/uL (ref 0.1–0.9)
Monocytes: 9 %
NEUTROS ABS: 5.2 10*3/uL (ref 1.4–7.0)
Neutrophils: 72 %
Platelets: 252 10*3/uL (ref 150–450)
RBC: 5.37 x10E6/uL (ref 4.14–5.80)
RDW: 13.1 % (ref 12.3–15.4)
WBC: 7.1 10*3/uL (ref 3.4–10.8)

## 2018-07-09 LAB — LIPID PANEL WITH LDL/HDL RATIO
CHOLESTEROL TOTAL: 211 mg/dL — AB (ref 100–199)
HDL: 60 mg/dL (ref >39)
LDL Calculated: 113 mg/dL — ABNORMAL HIGH (ref 0–99)
LDl/HDL Ratio: 1.9 ratio (ref 0.0–3.6)
Triglycerides: 192 mg/dL — ABNORMAL HIGH (ref 0–149)
VLDL Cholesterol Cal: 38 mg/dL (ref 5–40)

## 2018-07-09 LAB — PSA: PROSTATE SPECIFIC AG, SERUM: 0.5 ng/mL (ref 0.0–4.0)

## 2018-07-09 LAB — TSH: TSH: 1.72 u[IU]/mL (ref 0.450–4.500)

## 2018-07-13 ENCOUNTER — Telehealth: Payer: Self-pay

## 2018-07-13 NOTE — Telephone Encounter (Signed)
Left message to call back  

## 2018-07-13 NOTE — Telephone Encounter (Signed)
-----   Message from Maple Hudsonichard L Gilbert Jr., MD sent at 07/09/2018  8:32 AM EDT ----- Prediabetic--Diet and exercise.Would consider recheck 3-4 months for BS/A1C.

## 2018-07-15 NOTE — Telephone Encounter (Signed)
Left message to call back  

## 2018-07-20 NOTE — Telephone Encounter (Signed)
Advised patient of results.  

## 2018-11-25 ENCOUNTER — Ambulatory Visit (INDEPENDENT_AMBULATORY_CARE_PROVIDER_SITE_OTHER): Payer: No Typology Code available for payment source | Admitting: Family Medicine

## 2018-11-25 VITALS — BP 132/84 | HR 83 | Temp 97.6°F | Resp 16 | Wt 201.0 lb

## 2018-11-25 DIAGNOSIS — R739 Hyperglycemia, unspecified: Secondary | ICD-10-CM

## 2018-11-25 DIAGNOSIS — Z23 Encounter for immunization: Secondary | ICD-10-CM | POA: Diagnosis not present

## 2018-11-25 LAB — POCT GLYCOSYLATED HEMOGLOBIN (HGB A1C): Hemoglobin A1C: 5.5 % (ref 4.0–5.6)

## 2018-11-25 NOTE — Progress Notes (Signed)
George Hammans Sr.  MRN: [id number] DOB: 1961/07/30  Subjective:  HPI   The patient is a 57 year old who presents for 4 month follow up from his annual exam.  He had labs that revealed he was pre-diabetic and is here today to have an A1C checked. He is married to the father of 3 sons.  He is a new grandfather.  His father is recently died but he is coping well with that.  Lengthy battle with cancer.  Patient Active Problem List   Diagnosis Date Noted  . Dermatitis, eczematoid 06/10/2016  . Acid reflux 06/10/2016  . Avitaminosis D 04/02/2010  . Pure hypercholesterolemia 12/21/2009    Past Medical History:  Diagnosis Date  . Arthritis    L knee, has had treatment for steroids, for lumbar disc problem- 2016   . H/O exercise stress test 2014-   normal , done at [facility]    Social History   Socioeconomic History  . Marital status: Married    Spouse name: Not on file  . Number of children: Not on file  . Years of education: Not on file  . Highest education level: Not on file  Occupational History  . Not on file  Social Needs  . Financial resource strain: Not on file  . Food insecurity:    Worry: Not on file    Inability: Not on file  . Transportation needs:    Medical: Not on file    Non-medical: Not on file  Tobacco Use  . Smoking status: Never Smoker  . Smokeless tobacco: Never Used  Substance and Sexual Activity  . Alcohol use: Yes    Alcohol/week: 0.0 standard drinks    Comment: 2 to 3 drinks a week.  . Drug use: No  . Sexual activity: Not on file  Lifestyle  . Physical activity:    Days per week: Not on file    Minutes per session: Not on file  . Stress: Not on file  Relationships  . Social connections:    Talks on phone: Not on file    Gets together: Not on file    Attends religious service: Not on file    Active member of club or organization: Not on file    Attends meetings of clubs or organizations: Not on file    Relationship status: Not on  file  . Intimate partner violence:    Fear of current or ex partner: Not on file    Emotionally abused: Not on file    Physically abused: Not on file    Forced sexual activity: Not on file  Other Topics Concern  . Not on file  Social History Narrative  . Not on file    Outpatient Encounter Medications as of 11/25/2018  Medication Sig  . ibuprofen (ADVIL,MOTRIN) 200 MG tablet Take 800 mg by mouth every 6 (six) hours as needed.   No facility-administered encounter medications on file as of 11/25/2018.     No Known Allergies  Review of Systems  Constitutional: Negative for fever and malaise/fatigue.  Eyes: Negative.   Respiratory: Negative for cough, shortness of breath and wheezing.   Cardiovascular: Negative for chest pain, palpitations, orthopnea, claudication and leg swelling.  Endo/Heme/Allergies: Negative.   Psychiatric/Behavioral: Negative.     Objective:  BP 132/84 (BP Location: Right Arm, Patient Position: Sitting, Cuff Size: Normal)   Pulse 83   Temp 97.6 F (36.4 C) (Oral)   Resp 16   Wt 201 lb (  91.2 kg)   SpO2 98%   BMI 31.02 kg/m   Physical Exam  Constitutional: He is oriented to person, place, and time and well-developed, well-nourished, and in no distress.  HENT:  Head: Normocephalic and atraumatic.  Right Ear: External ear normal.  Left Ear: External ear normal.  Nose: Nose normal.  Mouth/Throat: Oropharynx is clear and moist.  Eyes: Conjunctivae are normal. No scleral icterus.  Neck: No thyromegaly present.  Cardiovascular: Normal rate, regular rhythm and normal heart sounds.  Pulmonary/Chest: Effort normal and breath sounds normal.  Neurological: He is alert and oriented to person, place, and time. Gait normal. GCS score is 15.  Skin: Skin is warm and dry.  Psychiatric: Mood, memory, affect and judgment normal.    Assessment and Plan :   1. Hyperglycemia Discussed lifestyle intervention and keeping weight down.  See him back in 6 months for a  physical. - POCT glycosylated hemoglobin (Hb A1C)--5.5  2. Need for influenza vaccination  - Flu Vaccine QUAD 6+ mos PF IM (Fluarix Quad PF)  I have done the exam and reviewed the chart and it is accurate to the best of my knowledge. DentistDragon  technology has been used and  any errors in dictation or transcription are unintentional. Julieanne Mansonichard Gilbert M.D. [facility]   HPI, Exam and A&P Transcribed under the direction and in the presence of Megan Mansichard Gilbert, Jr., MD. Electronically Signed: Janey GreaserElena DeSanto, RMA

## 2019-07-07 ENCOUNTER — Encounter: Payer: Self-pay | Admitting: Family Medicine

## 2019-07-07 ENCOUNTER — Ambulatory Visit (INDEPENDENT_AMBULATORY_CARE_PROVIDER_SITE_OTHER): Payer: PRIVATE HEALTH INSURANCE | Admitting: Family Medicine

## 2019-07-07 ENCOUNTER — Other Ambulatory Visit: Payer: Self-pay

## 2019-07-07 VITALS — BP 112/68 | HR 60 | Temp 98.2°F | Resp 16 | Ht 67.5 in | Wt 200.0 lb

## 2019-07-07 DIAGNOSIS — Z125 Encounter for screening for malignant neoplasm of prostate: Secondary | ICD-10-CM | POA: Diagnosis not present

## 2019-07-07 DIAGNOSIS — Z0000 Encounter for general adult medical examination without abnormal findings: Secondary | ICD-10-CM

## 2019-07-07 LAB — POCT URINALYSIS DIPSTICK
Bilirubin, UA: NEGATIVE
Blood, UA: NEGATIVE
Glucose, UA: NEGATIVE
Ketones, UA: NEGATIVE
Leukocytes, UA: NEGATIVE
Nitrite, UA: NEGATIVE
Protein, UA: NEGATIVE
Spec Grav, UA: 1.02 (ref 1.010–1.025)
Urobilinogen, UA: 0.2 E.U./dL
pH, UA: 5 (ref 5.0–8.0)

## 2019-07-07 NOTE — Progress Notes (Signed)
Patient: George Staples., Male    DOB: 11/26/1961, 58 y.o.   MRN: [id number] Visit Date: 07/07/2019  Today's Provider: Wilhemena Durie, MD   Chief Complaint  Patient presents with  . Annual Exam   Subjective:     Annual physical exam George CHEEVER Sr. is a 58 y.o. male who presents today for health maintenance and complete physical. He feels well. He reports exercising not regularly, but he does stay active. He reports he is sleeping well.He feels well.  Colonoscopy- 12/24/2017. Internal hemorrhoids. Diverticulosis. Dr. Vira Agar. Repeat 12/2022.  Tdap- 03/02/2006 Td- 11/08/2017.    Review of Systems  Constitutional: Negative.   HENT: Negative.   Eyes: Negative.   Respiratory: Negative.   Cardiovascular: Negative.   Gastrointestinal: Negative.   Endocrine: Negative.   Genitourinary: Negative.   Musculoskeletal: Negative.   Allergic/Immunologic: Negative.   Neurological: Negative.   Hematological: Negative.   Psychiatric/Behavioral: Negative.     Social History      He  reports that he has never smoked. He has never used smokeless tobacco. He reports current alcohol use. He reports that he does not use drugs.       Social History   Socioeconomic History  . Marital status: Married    Spouse name: Not on file  . Number of children: Not on file  . Years of education: Not on file  . Highest education level: Not on file  Occupational History  . Not on file  Social Needs  . Financial resource strain: Not on file  . Food insecurity    Worry: Not on file    Inability: Not on file  . Transportation needs    Medical: Not on file    Non-medical: Not on file  Tobacco Use  . Smoking status: Never Smoker  . Smokeless tobacco: Never Used  Substance and Sexual Activity  . Alcohol use: Yes    Alcohol/week: 0.0 standard drinks    Comment: 2 to 3 drinks a week.  . Drug use: No  . Sexual activity: Not on file  Lifestyle  . Physical activity    Days per week: Not  on file    Minutes per session: Not on file  . Stress: Not on file  Relationships  . Social Herbalist on phone: Not on file    Gets together: Not on file    Attends religious service: Not on file    Active member of club or organization: Not on file    Attends meetings of clubs or organizations: Not on file    Relationship status: Not on file  Other Topics Concern  . Not on file  Social History Narrative  . Not on file    Past Medical History:  Diagnosis Date  . Arthritis    L knee, has had treatment for steroids, for lumbar disc problem- 2016   . H/O exercise stress test 2014-   normal , done at [facility]     Patient Active Problem List   Diagnosis Date Noted  . Dermatitis, eczematoid 06/10/2016  . Acid reflux 06/10/2016  . Avitaminosis D 04/02/2010  . Pure hypercholesterolemia 12/21/2009    Past Surgical History:  Procedure Laterality Date  . COLONOSCOPY  07/10/14   tubular adenoma and hyperplastic polyps repeat in 3 years  . HERNIA REPAIR     age 27, L inguinal   . KNEE SURGERY Left   . TONSILLECTOMY  Family History        Family Status  Relation Name Status  . Mother  Alive  . Father  Alive  . Sister ##Sister1 Alive  . Brother ##Brother1 Alive  . Brother ##Brother2 Alive        His family history includes Diabetes in his father; Hyperlipidemia in his father; Hypertension in his brother, father, and mother; Stroke in his father.      No Known Allergies   Current Outpatient Medications:  .  ibuprofen (ADVIL,MOTRIN) 200 MG tablet, Take 800 mg by mouth every 6 (six) hours as needed., Disp: , Rfl:    Patient Care Team: Maple HudsonGilbert, George Umar L Logan., MD as PCP - General (Family Medicine)    Objective:    Vitals: BP 112/68   Pulse 60   Temp 98.2 F (36.8 C)   Resp 16   Ht 5' 7.5" (1.715 m)   Wt 200 lb (90.7 kg)   SpO2 97%   BMI 30.86 kg/m    Vitals:   07/07/19 0914  BP: 112/68  Pulse: 60  Resp: 16  Temp: 98.2 F (36.8 C)   SpO2: 97%  Weight: 200 lb (90.7 kg)  Height: 5' 7.5" (1.715 m)     Physical Exam Vitals signs reviewed.  Constitutional:      Appearance: He is well-developed.  HENT:     Head: Normocephalic and atraumatic.     Right Ear: External ear normal.     Left Ear: External ear normal.     Nose: Nose normal.  Eyes:     Conjunctiva/sclera: Conjunctivae normal.     Pupils: Pupils are equal, round, and reactive to light.  Neck:     Musculoskeletal: Normal range of motion and neck supple.  Cardiovascular:     Rate and Rhythm: Normal rate and regular rhythm.     Heart sounds: Normal heart sounds.  Pulmonary:     Effort: Pulmonary effort is normal.     Breath sounds: Normal breath sounds.  Abdominal:     General: Bowel sounds are normal.     Palpations: Abdomen is soft.  Genitourinary:    Penis: Normal.      Prostate: Normal.     Rectum: Normal.  Musculoskeletal: Normal range of motion.  Skin:    General: Skin is warm and dry.  Neurological:     Mental Status: He is oriented to person, place, and time.  Psychiatric:        Behavior: Behavior normal.        Thought Content: Thought content normal.        Judgment: Judgment normal.      Depression Screen PHQ 2/9 Scores 07/07/2019 07/08/2018 03/17/2018 06/11/2016  PHQ - 2 Score 0 0 0 0       Assessment & Plan:     Routine Health Maintenance and Physical Exam  Exercise Activities and Dietary recommendations Goals   None     Immunization History  Administered Date(s) Administered  . Influenza Inj Mdck Quad Pf 09/26/2017  . Influenza,inj,Quad PF,6+ Mos 11/25/2018  . Tdap 03/02/2006    Health Maintenance  Topic Date Due  . HIV Screening  11/01/1976  . TETANUS/TDAP  03/02/2016  . INFLUENZA VACCINE  07/16/2019  . COLONOSCOPY  12/24/2020  . Hepatitis C Screening  Completed     Discussed health benefits of physical activity, and encouraged him to engage in regular exercise appropriate for his age and condition.  1.  Annual physical exam RTC 1 year. -  CBC with Differential/Platelet - Comprehensive metabolic panel - Lipid panel - TSH - POCT urinalysis dipstick  2. Prostate cancer screening  - PSA    George Mansichard Bentlie Withem Jr, MD  [facility]

## 2019-07-08 LAB — CBC WITH DIFFERENTIAL/PLATELET
Basophils Absolute: 0 10*3/uL (ref 0.0–0.2)
Basos: 1 %
EOS (ABSOLUTE): 0.1 10*3/uL (ref 0.0–0.4)
Eos: 2 %
Hematocrit: 42.1 % (ref 37.5–51.0)
Hemoglobin: 14.4 g/dL (ref 13.0–17.7)
Immature Grans (Abs): 0.1 10*3/uL (ref 0.0–0.1)
Immature Granulocytes: 1 %
Lymphocytes Absolute: 1.2 10*3/uL (ref 0.7–3.1)
Lymphs: 15 %
MCH: 29.6 pg (ref 26.6–33.0)
MCHC: 34.2 g/dL (ref 31.5–35.7)
MCV: 87 fL (ref 79–97)
Monocytes Absolute: 0.7 10*3/uL (ref 0.1–0.9)
Monocytes: 9 %
Neutrophils Absolute: 5.7 10*3/uL (ref 1.4–7.0)
Neutrophils: 72 %
Platelets: 233 10*3/uL (ref 150–450)
RBC: 4.86 x10E6/uL (ref 4.14–5.80)
RDW: 12.4 % (ref 11.6–15.4)
WBC: 7.8 10*3/uL (ref 3.4–10.8)

## 2019-07-08 LAB — PSA: Prostate Specific Ag, Serum: 0.5 ng/mL (ref 0.0–4.0)

## 2019-07-08 LAB — COMPREHENSIVE METABOLIC PANEL
ALT: 17 IU/L (ref 0–44)
AST: 21 IU/L (ref 0–40)
Albumin/Globulin Ratio: 2.3 — ABNORMAL HIGH (ref 1.2–2.2)
Albumin: 4.6 g/dL (ref 3.8–4.9)
Alkaline Phosphatase: 60 IU/L (ref 39–117)
BUN/Creatinine Ratio: 12 (ref 9–20)
BUN: 11 mg/dL (ref 6–24)
Bilirubin Total: 0.8 mg/dL (ref 0.0–1.2)
CO2: 22 mmol/L (ref 20–29)
Calcium: 9.5 mg/dL (ref 8.7–10.2)
Chloride: 103 mmol/L (ref 96–106)
Creatinine, Ser: 0.92 mg/dL (ref 0.76–1.27)
GFR calc Af Amer: 106 mL/min/{1.73_m2} (ref >59)
GFR calc non Af Amer: 92 mL/min/{1.73_m2} (ref >59)
Globulin, Total: 2 g/dL (ref 1.5–4.5)
Glucose: 99 mg/dL (ref 65–99)
Potassium: 4.2 mmol/L (ref 3.5–5.2)
Sodium: 138 mmol/L (ref 134–144)
Total Protein: 6.6 g/dL (ref 6.0–8.5)

## 2019-07-08 LAB — LIPID PANEL
Chol/HDL Ratio: 3 ratio (ref 0.0–5.0)
Cholesterol, Total: 182 mg/dL (ref 100–199)
HDL: 60 mg/dL (ref >39)
LDL Calculated: 86 mg/dL (ref 0–99)
Triglycerides: 179 mg/dL — ABNORMAL HIGH (ref 0–149)
VLDL Cholesterol Cal: 36 mg/dL (ref 5–40)

## 2019-07-08 LAB — TSH: TSH: 1.98 u[IU]/mL (ref 0.450–4.500)

## 2019-09-01 ENCOUNTER — Telehealth: Payer: Self-pay | Admitting: Family Medicine

## 2019-09-01 NOTE — Telephone Encounter (Signed)
Patient said he never got a call about his labs done in July of 2020 for his CPE. Please call patient and he said you could leave a message.

## 2019-09-02 NOTE — Telephone Encounter (Signed)
Patient notified about lab results.

## 2020-07-09 NOTE — Progress Notes (Signed)
Complete physical exam   Patient: George CANTERBURY Sr.   DOB: 14-Feb-1961   59 y.o. Male  MRN: [id number] Visit Date: 07/12/2020  Today's healthcare provider: Megan Mans, MD   Chief Complaint  Patient presents with  . Annual Exam   Subjective    George PETREY Sr. is a 59 y.o. male who presents today for a complete physical exam.  He reports consuming a general diet. The patient does not participate in regular exercise at present. He generally feels well. He reports sleeping well. He does not have additional problems to discuss today.  HPI    Last colonoscopy: 12/24/2017  Past Medical History:  Diagnosis Date  . Arthritis    L knee, has had treatment for steroids, for lumbar disc problem- 2016   . H/O exercise stress test 2014-   normal , done at [facility]   Past Surgical History:  Procedure Laterality Date  . COLONOSCOPY  07/10/14   tubular adenoma and hyperplastic polyps repeat in 3 years  . HERNIA REPAIR     age 14, L inguinal   . KNEE SURGERY Left   . TONSILLECTOMY     Social History   Socioeconomic History  . Marital status: Married    Spouse name: Not on file  . Number of children: Not on file  . Years of education: Not on file  . Highest education level: Not on file  Occupational History  . Not on file  Tobacco Use  . Smoking status: Never Smoker  . Smokeless tobacco: Never Used  Substance and Sexual Activity  . Alcohol use: Yes    Alcohol/week: 0.0 standard drinks    Comment: 2 to 3 drinks a week.  . Drug use: No  . Sexual activity: Not on file  Other Topics Concern  . Not on file  Social History Narrative  . Not on file   Social Determinants of Health   Financial Resource Strain:   . Difficulty of Paying Living Expenses:   Food Insecurity:   . Worried About Programme researcher, broadcasting/film/video in the Last Year:   . Barista in the Last Year:   Transportation Needs:   . Freight forwarder (Medical):   Marland Kitchen Lack of Transportation  (Non-Medical):   Physical Activity:   . Days of Exercise per Week:   . Minutes of Exercise per Session:   Stress:   . Feeling of Stress :   Social Connections:   . Frequency of Communication with Friends and Family:   . Frequency of Social Gatherings with Friends and Family:   . Attends Religious Services:   . Active Member of Clubs or Organizations:   . Attends Banker Meetings:   Marland Kitchen Marital Status:   Intimate Partner Violence:   . Fear of Current or Ex-Partner:   . Emotionally Abused:   Marland Kitchen Physically Abused:   . Sexually Abused:    Family Status  Relation Name Status  . Mother  Alive  . Father  Alive  . Sister ##Sister1 Alive  . Brother ##Brother1 Alive  . Brother ##Brother2 Alive   Family History  Problem Relation Age of Onset  . Hypertension Mother   . Diabetes Father   . Hypertension Father   . Hyperlipidemia Father   . Stroke Father   . Hypertension Brother    No Known Allergies  Patient Care Team: Maple Hudson., MD as PCP - General (Family Medicine)  Medications: Outpatient Medications Prior to Visit  Medication Sig  . ibuprofen (ADVIL,MOTRIN) 200 MG tablet Take 800 mg by mouth every 6 (six) hours as needed. (Patient not taking: Reported on 07/12/2020)   No facility-administered medications prior to visit.    Review of Systems  Constitutional: Negative.   HENT: Negative.   Eyes: Negative.   Respiratory: Negative.   Cardiovascular: Negative.   Gastrointestinal: Negative.   Genitourinary: Negative.   Musculoskeletal: Positive for arthralgias.  Skin: Negative.   Allergic/Immunologic: Negative.   Neurological: Negative.   Hematological: Negative.   Psychiatric/Behavioral: Negative.     Last lipids Lab Results  Component Value Date   CHOL 182 07/07/2019   HDL 60 07/07/2019   LDLCALC 86 07/07/2019   TRIG 179 (H) 07/07/2019   CHOLHDL 3.0 07/07/2019      Objective    BP (!) 129/88 (BP Location: Left Arm, Patient Position:  Sitting, Cuff Size: Large)   Pulse 64   Temp (!) 96.9 F (36.1 C) (Temporal)   Ht 5\' 8"  (1.727 m)   Wt (!) 210 lb (95.3 kg)   BMI 31.93 kg/m  BP Readings from Last 3 Encounters:  07/12/20 (!) 129/88  07/07/19 112/68  11/25/18 132/84   Wt Readings from Last 3 Encounters:  07/12/20 (!) 210 lb (95.3 kg)  07/07/19 200 lb (90.7 kg)  11/25/18 201 lb (91.2 kg)      Physical Exam Vitals reviewed.  Constitutional:      Appearance: He is well-developed.  HENT:     Head: Normocephalic and atraumatic.     Right Ear: External ear normal.     Left Ear: External ear normal.     Nose: Nose normal.  Eyes:     Conjunctiva/sclera: Conjunctivae normal.     Pupils: Pupils are equal, round, and reactive to light.  Cardiovascular:     Rate and Rhythm: Normal rate and regular rhythm.     Heart sounds: Normal heart sounds.  Pulmonary:     Effort: Pulmonary effort is normal.     Breath sounds: Normal breath sounds.  Abdominal:     General: Bowel sounds are normal.     Palpations: Abdomen is soft.  Genitourinary:    Penis: Normal.      Testes: Normal.     Prostate: Normal.     Rectum: Normal.  Musculoskeletal:        General: Normal range of motion.     Cervical back: Normal range of motion and neck supple.  Skin:    General: Skin is warm and dry.  Neurological:     General: No focal deficit present.     Mental Status: He is oriented to person, place, and time.  Psychiatric:        Mood and Affect: Mood normal.        Behavior: Behavior normal.        Thought Content: Thought content normal.        Judgment: Judgment normal.       Last depression screening scores PHQ 2/9 Scores 07/12/2020 07/07/2019 07/08/2018  PHQ - 2 Score 0 0 0  PHQ- 9 Score 0 - -   Last fall risk screening Fall Risk  07/12/2020  Falls in the past year? 0  Number falls in past yr: 0  Injury with Fall? 0  Follow up Falls evaluation completed   Last Audit-C alcohol use screening Alcohol Use Disorder Test  (AUDIT) 07/12/2020  1. How often do you have a drink  containing alcohol? 4  2. How many drinks containing alcohol do you have on a typical day when you are drinking? 0  3. How often do you have six or more drinks on one occasion? 0  AUDIT-C Score 4  4. How often during the last year have you found that you were not able to stop drinking once you had started? 0  5. How often during the last year have you failed to do what was normally expected from you because of drinking? 0  6. How often during the last year have you needed a first drink in the morning to get yourself going after a heavy drinking session? 0  7. How often during the last year have you had a feeling of guilt of remorse after drinking? 0  8. How often during the last year have you been unable to remember what happened the night before because you had been drinking? 0  9. Have you or someone else been injured as a result of your drinking? 0  10. Has a relative or friend or a doctor or another health worker been concerned about your drinking or suggested you cut down? 0  Alcohol Use Disorder Identification Test Final Score (AUDIT) 4  Alcohol Brief Interventions/Follow-up -   A score of 3 or more in women, and 4 or more in men indicates increased risk for alcohol abuse, EXCEPT if all of the points are from question 1   Results for orders placed or performed in visit on 07/12/20  IFOBT POC (occult bld, rslt in office)  Result Value Ref Range   IFOBT Negative     Assessment & Plan    Routine Health Maintenance and Physical Exam  Exercise Activities and Dietary recommendations Goals   None     Immunization History  Administered Date(s) Administered  . Influenza Inj Mdck Quad Pf 09/26/2017  . Influenza,inj,Quad PF,6+ Mos 11/25/2018  . Tdap 03/02/2006    Health Maintenance  Topic Date Due  . COVID-19 Vaccine (1) Never done  . HIV Screening  Never done  . TETANUS/TDAP  03/02/2016  . INFLUENZA VACCINE  07/15/2020  .  COLONOSCOPY  12/24/2020  . Hepatitis C Screening  Completed    Discussed health benefits of physical activity, and encouraged him to engage in regular exercise appropriate for his age and condition. 1. Annual physical exam Diet and exercise and weight loss are stressed.  2. Hyperglycemia Patient is ready to work on his lifestyle. - CBC with Differential/Platelet - Comprehensive metabolic panel - TSH - Lipid panel  3. Prostate cancer screening  - PSA  4. Pure hypercholesterolemia  - CBC with Differential/Platelet - Comprehensive metabolic panel - TSH - Lipid panel  5. Encounter for screening fecal occult blood testing  - IFOBT POC (occult bld, rslt in office); Future - IFOBT POC (occult bld, rslt in office)  6. Screening for blood or protein in urine  - POCT urinalysis dipstick    Return in about 1 year (around 07/12/2021) for CPE .        Averee Harb Wendelyn Breslow, MD  [facility] [phone] (phone) [phone] (fax)  [facility]

## 2020-07-12 ENCOUNTER — Other Ambulatory Visit: Payer: Self-pay

## 2020-07-12 ENCOUNTER — Ambulatory Visit (INDEPENDENT_AMBULATORY_CARE_PROVIDER_SITE_OTHER): Payer: PRIVATE HEALTH INSURANCE | Admitting: Family Medicine

## 2020-07-12 ENCOUNTER — Encounter: Payer: Self-pay | Admitting: Family Medicine

## 2020-07-12 VITALS — BP 129/88 | HR 64 | Temp 96.9°F | Ht 68.0 in | Wt 210.0 lb

## 2020-07-12 DIAGNOSIS — E7800 Pure hypercholesterolemia, unspecified: Secondary | ICD-10-CM | POA: Diagnosis not present

## 2020-07-12 DIAGNOSIS — Z1211 Encounter for screening for malignant neoplasm of colon: Secondary | ICD-10-CM | POA: Diagnosis not present

## 2020-07-12 DIAGNOSIS — Z1389 Encounter for screening for other disorder: Secondary | ICD-10-CM

## 2020-07-12 DIAGNOSIS — Z0000 Encounter for general adult medical examination without abnormal findings: Secondary | ICD-10-CM | POA: Diagnosis not present

## 2020-07-12 DIAGNOSIS — Z125 Encounter for screening for malignant neoplasm of prostate: Secondary | ICD-10-CM

## 2020-07-12 DIAGNOSIS — R739 Hyperglycemia, unspecified: Secondary | ICD-10-CM | POA: Diagnosis not present

## 2020-07-12 LAB — IFOBT (OCCULT BLOOD): IFOBT: NEGATIVE

## 2020-07-12 LAB — POCT URINALYSIS DIPSTICK
Bilirubin, UA: NEGATIVE
Blood, UA: NEGATIVE
Glucose, UA: NEGATIVE
Ketones, UA: NEGATIVE
Leukocytes, UA: NEGATIVE
Nitrite, UA: NEGATIVE
Protein, UA: NEGATIVE
Spec Grav, UA: 1.01 (ref 1.010–1.025)
Urobilinogen, UA: 0.2 E.U./dL
pH, UA: 6 (ref 5.0–8.0)

## 2020-07-13 LAB — CBC WITH DIFFERENTIAL/PLATELET
Basophils Absolute: 0 10*3/uL (ref 0.0–0.2)
Basos: 1 %
EOS (ABSOLUTE): 0.1 10*3/uL (ref 0.0–0.4)
Eos: 2 %
Hematocrit: 44.5 % (ref 37.5–51.0)
Hemoglobin: 15.4 g/dL (ref 13.0–17.7)
Immature Grans (Abs): 0.1 10*3/uL (ref 0.0–0.1)
Immature Granulocytes: 1 %
Lymphocytes Absolute: 1.3 10*3/uL (ref 0.7–3.1)
Lymphs: 19 %
MCH: 29.3 pg (ref 26.6–33.0)
MCHC: 34.6 g/dL (ref 31.5–35.7)
MCV: 85 fL (ref 79–97)
Monocytes Absolute: 0.6 10*3/uL (ref 0.1–0.9)
Monocytes: 9 %
Neutrophils Absolute: 4.9 10*3/uL (ref 1.4–7.0)
Neutrophils: 68 %
Platelets: 250 10*3/uL (ref 150–450)
RBC: 5.25 x10E6/uL (ref 4.14–5.80)
RDW: 13 % (ref 11.6–15.4)
WBC: 7.1 10*3/uL (ref 3.4–10.8)

## 2020-07-13 LAB — LIPID PANEL
Chol/HDL Ratio: 3.4 ratio (ref 0.0–5.0)
Cholesterol, Total: 195 mg/dL (ref 100–199)
HDL: 58 mg/dL (ref >39)
LDL Chol Calc (NIH): 108 mg/dL — ABNORMAL HIGH (ref 0–99)
Triglycerides: 169 mg/dL — ABNORMAL HIGH (ref 0–149)
VLDL Cholesterol Cal: 29 mg/dL (ref 5–40)

## 2020-07-13 LAB — COMPREHENSIVE METABOLIC PANEL
ALT: 21 IU/L (ref 0–44)
AST: 23 IU/L (ref 0–40)
Albumin/Globulin Ratio: 2 (ref 1.2–2.2)
Albumin: 4.5 g/dL (ref 3.8–4.9)
Alkaline Phosphatase: 65 IU/L (ref 48–121)
BUN/Creatinine Ratio: 11 (ref 9–20)
BUN: 11 mg/dL (ref 6–24)
Bilirubin Total: 0.7 mg/dL (ref 0.0–1.2)
CO2: 23 mmol/L (ref 20–29)
Calcium: 9.7 mg/dL (ref 8.7–10.2)
Chloride: 101 mmol/L (ref 96–106)
Creatinine, Ser: 0.96 mg/dL (ref 0.76–1.27)
GFR calc Af Amer: 100 mL/min/{1.73_m2} (ref >59)
GFR calc non Af Amer: 87 mL/min/{1.73_m2} (ref >59)
Globulin, Total: 2.2 g/dL (ref 1.5–4.5)
Glucose: 110 mg/dL — ABNORMAL HIGH (ref 65–99)
Potassium: 4.4 mmol/L (ref 3.5–5.2)
Sodium: 138 mmol/L (ref 134–144)
Total Protein: 6.7 g/dL (ref 6.0–8.5)

## 2020-07-13 LAB — PSA: Prostate Specific Ag, Serum: 0.5 ng/mL (ref 0.0–4.0)

## 2020-07-13 LAB — TSH: TSH: 1.64 u[IU]/mL (ref 0.450–4.500)

## 2020-11-13 ENCOUNTER — Other Ambulatory Visit: Payer: Self-pay

## 2020-11-13 ENCOUNTER — Ambulatory Visit (INDEPENDENT_AMBULATORY_CARE_PROVIDER_SITE_OTHER): Payer: PRIVATE HEALTH INSURANCE | Admitting: Family Medicine

## 2020-11-13 DIAGNOSIS — Z23 Encounter for immunization: Secondary | ICD-10-CM | POA: Diagnosis not present

## 2021-07-18 ENCOUNTER — Encounter: Payer: Self-pay | Admitting: Family Medicine

## 2021-11-25 ENCOUNTER — Encounter: Payer: Self-pay | Admitting: Family Medicine

## 2022-03-20 ENCOUNTER — Encounter: Payer: PRIVATE HEALTH INSURANCE | Admitting: Family Medicine

## 2022-05-08 ENCOUNTER — Ambulatory Visit (INDEPENDENT_AMBULATORY_CARE_PROVIDER_SITE_OTHER): Payer: PRIVATE HEALTH INSURANCE | Admitting: Family Medicine

## 2022-05-08 ENCOUNTER — Encounter: Payer: Self-pay | Admitting: Family Medicine

## 2022-05-08 VITALS — BP 115/84 | HR 65 | Temp 97.4°F | Resp 14 | Ht 68.0 in | Wt 199.0 lb

## 2022-05-08 DIAGNOSIS — Z23 Encounter for immunization: Secondary | ICD-10-CM

## 2022-05-08 DIAGNOSIS — Z0000 Encounter for general adult medical examination without abnormal findings: Secondary | ICD-10-CM

## 2022-05-08 DIAGNOSIS — K219 Gastro-esophageal reflux disease without esophagitis: Secondary | ICD-10-CM

## 2022-05-08 DIAGNOSIS — E7800 Pure hypercholesterolemia, unspecified: Secondary | ICD-10-CM

## 2022-05-08 DIAGNOSIS — Z125 Encounter for screening for malignant neoplasm of prostate: Secondary | ICD-10-CM

## 2022-05-08 LAB — POCT URINALYSIS DIPSTICK
Bilirubin, UA: NEGATIVE
Glucose, UA: NEGATIVE
Ketones, UA: NEGATIVE
Leukocytes, UA: NEGATIVE
Nitrite, UA: NEGATIVE
Protein, UA: NEGATIVE
Spec Grav, UA: 1.02 (ref 1.010–1.025)
Urobilinogen, UA: 0.2 E.U./dL
pH, UA: 6 (ref 5.0–8.0)

## 2022-05-08 NOTE — Progress Notes (Signed)
I,Roshena L Chambers,acting as a scribe for Megan Mans, MD.,have documented all relevant documentation on the behalf of Megan Mans, MD,as directed by  Megan Mans, MD while in the presence of Megan Mans, MD.   Complete physical exam   Patient: George WHITTER Sr.   DOB: [birth year]   61 y.o. Male  MRN: [id number] Visit Date: 05/08/2022  Today's healthcare provider: Megan Mans, MD   Chief Complaint  Patient presents with   Annual Exam   Subjective    George GRUENHAGEN Sr. is a 61 y.o. male who presents today for a complete physical exam.  He reports consuming a general diet. Home exercise routine includes walking and yard work. He generally feels fairly well. He reports sleeping fairly well. He does not have additional problems to discuss today.  He is married and is father of 3 sons.  He is starting to step back to work a little bit. HPI  He feels well and is lost 20 pounds since January with dietary changes He does have chronic left knee pain which is becoming more frequent Past Medical History:  Diagnosis Date   Arthritis    L knee, has had treatment for steroids, for lumbar disc problem- 2016    H/O exercise stress test 2014-   normal , done at [facility]   Past Surgical History:  Procedure Laterality Date   COLONOSCOPY  07/10/14   tubular adenoma and hyperplastic polyps repeat in 3 years   HERNIA REPAIR     age 13, L inguinal    KNEE SURGERY Left    TONSILLECTOMY     Social History   Socioeconomic History   Marital status: Married    Spouse name: Not on file   Number of children: Not on file   Years of education: Not on file   Highest education level: Not on file  Occupational History   Not on file  Tobacco Use   Smoking status: Never   Smokeless tobacco: Never  Substance and Sexual Activity   Alcohol use: Yes    Alcohol/week: 0.0 standard drinks    Comment: 2 to 3 drinks a week.   Drug use: No   Sexual activity: Not on  file  Other Topics Concern   Not on file  Social History Narrative   Not on file   Social Determinants of Health   Financial Resource Strain: Not on file  Food Insecurity: Not on file  Transportation Needs: Not on file  Physical Activity: Not on file  Stress: Not on file  Social Connections: Not on file  Intimate Partner Violence: Not on file   Family Status  Relation Name Status   Mother  Alive   Father  Alive   Sister  Alive   Brother  Alive   Brother  Alive   Family History  Problem Relation Age of Onset   Hypertension Mother    Diabetes Father    Hypertension Father    Hyperlipidemia Father    Stroke Father    Hypertension Brother    No Known Allergies  Patient Care Team: Maple Hudson., MD as PCP - General (Family Medicine)   Medications: Outpatient Medications Prior to Visit  Medication Sig   ibuprofen (ADVIL,MOTRIN) 200 MG tablet Take 800 mg by mouth every 6 (six) hours as needed. (Patient not taking: Reported on 05/08/2022)   No facility-administered medications prior to visit.    Review of Systems  Constitutional:  Negative for appetite change, chills, fatigue and fever.  HENT:  Negative for congestion, ear pain, hearing loss, nosebleeds and trouble swallowing.   Eyes:  Negative for pain and visual disturbance.  Respiratory:  Negative for cough, chest tightness and shortness of breath.   Cardiovascular:  Negative for chest pain, palpitations and leg swelling.  Gastrointestinal:  Negative for abdominal pain, blood in stool, constipation, diarrhea, nausea and vomiting.  Endocrine: Negative for polydipsia, polyphagia and polyuria.  Genitourinary:  Negative for dysuria and flank pain.  Musculoskeletal:  Negative for arthralgias, back pain, joint swelling, myalgias and neck stiffness.  Skin:  Negative for color change, rash and wound.  Neurological:  Negative for dizziness, tremors, seizures, speech difficulty, weakness, light-headedness and  headaches.  Psychiatric/Behavioral:  Negative for behavioral problems, confusion, decreased concentration, dysphoric mood and sleep disturbance. The patient is not nervous/anxious.   All other systems reviewed and are negative.  Last lipids Lab Results  Component Value Date   CHOL 195 07/12/2020   HDL 58 07/12/2020   LDLCALC 108 (H) 07/12/2020   TRIG 169 (H) 07/12/2020   CHOLHDL 3.4 07/12/2020      Objective     BP 115/84 (BP Location: Right Arm, Patient Position: Sitting, Cuff Size: Large)   Pulse 65   Temp (!) 97.4 F (36.3 C) (Oral)   Resp 14   Ht  (1.727 m)   Wt 199 lb (90.3 kg)   SpO2 98% Comment: room air  BMI 30.26 kg/m  BP Readings from Last 3 Encounters:  05/08/22 115/84  07/12/20 (!) 129/88  07/07/19 112/68   Wt Readings from Last 3 Encounters:  05/08/22 199 lb (90.3 kg)  07/12/20 (!) 210 lb (95.3 kg)  07/07/19 200 lb (90.7 kg)       Physical Exam Vitals reviewed.  Constitutional:      Appearance: Normal appearance. He is well-developed.  HENT:     Head: Normocephalic and atraumatic.     Right Ear: External ear normal.     Left Ear: External ear normal.     Nose: Nose normal.  Eyes:     General: No scleral icterus.    Conjunctiva/sclera: Conjunctivae normal.     Pupils: Pupils are equal, round, and reactive to light.  Cardiovascular:     Rate and Rhythm: Normal rate and regular rhythm.     Heart sounds: Normal heart sounds.  Pulmonary:     Effort: Pulmonary effort is normal.     Breath sounds: Normal breath sounds.  Abdominal:     General: Bowel sounds are normal.     Palpations: Abdomen is soft.  Genitourinary:    Penis: Normal.      Testes: Normal.  Musculoskeletal:     Cervical back: Normal range of motion and neck supple.     Comments: Trace lower extremity edema  Skin:    General: Skin is warm and dry.  Neurological:     General: No focal deficit present.     Mental Status: He is alert and oriented to person, place, and time.   Psychiatric:        Mood and Affect: Mood normal.        Behavior: Behavior normal.        Thought Content: Thought content normal.        Judgment: Judgment normal.      Last depression screening scores    05/08/2022    3:39 PM 07/12/2020    9:27 AM 07/07/2019  9:20 AM  PHQ 2/9 Scores  PHQ - 2 Score 0 0 0  PHQ- 9 Score 0 0    Last fall risk screening    07/12/2020    9:27 AM  Fall Risk   Falls in the past year? 0  Number falls in past yr: 0  Injury with Fall? 0  Follow up Falls evaluation completed   Last Audit-C alcohol use screening    07/12/2020    9:26 AM  Alcohol Use Disorder Test (AUDIT)  1. How often do you have a drink containing alcohol? 4  2. How many drinks containing alcohol do you have on a typical day when you are drinking? 0  3. How often do you have six or more drinks on one occasion? 0  AUDIT-C Score 4  4. How often during the last year have you found that you were not able to stop drinking once you had started? 0  5. How often during the last year have you failed to do what was normally expected from you because of drinking? 0  6. How often during the last year have you needed a first drink in the morning to get yourself going after a heavy drinking session? 0  7. How often during the last year have you had a feeling of guilt of remorse after drinking? 0  8. How often during the last year have you been unable to remember what happened the night before because you had been drinking? 0  9. Have you or someone else been injured as a result of your drinking? 0  10. Has a relative or friend or a doctor or another health worker been concerned about your drinking or suggested you cut down? 0  Alcohol Use Disorder Identification Test Final Score (AUDIT) 4   A score of 3 or more in women, and 4 or more in men indicates increased risk for alcohol abuse, EXCEPT if all of the points are from question 1   Results for orders placed or performed in visit on 05/08/22   POCT Urinalysis Dipstick  Result Value Ref Range   Color, UA yellow    Clarity, UA clear    Glucose, UA Negative Negative   Bilirubin, UA negative    Ketones, UA negative    Spec Grav, UA 1.020 1.010 - 1.025   Blood, UA trace (non-hemolyzed)    pH, UA 6.0 5.0 - 8.0   Protein, UA Negative Negative   Urobilinogen, UA 0.2 0.2 or 1.0 E.U./dL   Nitrite, UA negative    Leukocytes, UA Negative Negative   Appearance     Odor      Assessment & Plan    Routine Health Maintenance and Physical Exam  Exercise Activities and Dietary recommendations  Goals   None     Immunization History  Administered Date(s) Administered   Hepatitis A 03/02/2006, 08/30/2008   IPV 03/02/2006   Influenza Inj Mdck Quad Pf 09/26/2017   Influenza,inj,Quad PF,6+ Mos 11/25/2018, 11/13/2020   MODERNA COVID-19 SARS-COV-2 PEDS BIVALENT BOOSTER 6Y-11Y 02/28/2020, 04/05/2020, 12/03/2020   Meningococcal Conjugate 03/02/2006   Tdap 03/02/2006, 11/08/2017, 05/08/2022   Typhoid Live 03/02/2006   Yellow Fever 03/02/2006   Zoster Recombinat (Shingrix) 05/08/2022    Health Maintenance  Topic Date Due   HIV Screening  Never done   COVID-19 Vaccine (1) 12/03/2020   COLONOSCOPY (Pts [id number] Insurance coverage will need to be confirmed)  12/24/2020   Zoster Vaccines- Shingrix (2 of 2) 07/03/2022   INFLUENZA  VACCINE  07/15/2022   TETANUS/TDAP  05/08/2032   Hepatitis C Screening  Completed   HPV VACCINES  Aged Out    Discussed health benefits of physical activity, and encouraged him to engage in regular exercise appropriate for his age and condition.  1. Annual physical exam Praised and encouraged the patient about his lifestyle changes recently.  He is doing very well with his dietary changes and with as of lifestyle changes. Follow-up 1 year. - Lipid panel - TSH - CBC w/Diff/Platelet - Comprehensive Metabolic Panel (CMET) - POCT Urinalysis Dipstick  2. Pure hypercholesterolemia Last LDL 108 - Lipid  panel - TSH - CBC w/Diff/Platelet - Comprehensive Metabolic Panel (CMET)  3. Gastroesophageal reflux disease, unspecified whether esophagitis present Controlled with lifestyle at this point - Lipid panel - TSH - CBC w/Diff/Platelet - Comprehensive Metabolic Panel (CMET)  4. Prostate cancer screening  - PSA  5. Need for shingles vaccine  - Administer Zoster, Recombinant (Shingrix) Vaccine  6. Need for tetanus, diphtheria, and acellular pertussis (Tdap) vaccine in patient of adolescent age or older  - Administer Tetanus-diphtheria-acellular pertussis (Tdap) vaccine   No follow-ups on file.     I, Megan Mans, MD, have reviewed all documentation for this visit. The documentation on 05/10/22 for the exam, diagnosis, procedures, and orders are all accurate and complete.    Daiwik Buffalo Wendelyn Breslow, MD  [facility] [phone] (phone) [phone] (fax)  [facility]

## 2022-05-16 LAB — CBC WITH DIFFERENTIAL/PLATELET
Basophils Absolute: 0.1 10*3/uL (ref 0.0–0.2)
Basos: 1 %
EOS (ABSOLUTE): 0.2 10*3/uL (ref 0.0–0.4)
Eos: 2 %
Hematocrit: 46.5 % (ref 37.5–51.0)
Hemoglobin: 15.4 g/dL (ref 13.0–17.7)
Immature Grans (Abs): 0.1 10*3/uL (ref 0.0–0.1)
Immature Granulocytes: 1 %
Lymphocytes Absolute: 1.3 10*3/uL (ref 0.7–3.1)
Lymphs: 18 %
MCH: 29 pg (ref 26.6–33.0)
MCHC: 33.1 g/dL (ref 31.5–35.7)
MCV: 88 fL (ref 79–97)
Monocytes Absolute: 0.8 10*3/uL (ref 0.1–0.9)
Monocytes: 10 %
Neutrophils Absolute: 5 10*3/uL (ref 1.4–7.0)
Neutrophils: 68 %
Platelets: 237 10*3/uL (ref 150–450)
RBC: 5.31 x10E6/uL (ref 4.14–5.80)
RDW: 13 % (ref 11.6–15.4)
WBC: 7.3 10*3/uL (ref 3.4–10.8)

## 2022-05-16 LAB — COMPREHENSIVE METABOLIC PANEL
ALT: 18 IU/L (ref 0–44)
AST: 22 IU/L (ref 0–40)
Albumin/Globulin Ratio: 2.4 — ABNORMAL HIGH (ref 1.2–2.2)
Albumin: 4.5 g/dL (ref 3.8–4.9)
Alkaline Phosphatase: 67 IU/L (ref 44–121)
BUN/Creatinine Ratio: 16 (ref 10–24)
BUN: 17 mg/dL (ref 8–27)
Bilirubin Total: 0.6 mg/dL (ref 0.0–1.2)
CO2: 23 mmol/L (ref 20–29)
Calcium: 9.7 mg/dL (ref 8.6–10.2)
Chloride: 103 mmol/L (ref 96–106)
Creatinine, Ser: 1.04 mg/dL (ref 0.76–1.27)
Globulin, Total: 1.9 g/dL (ref 1.5–4.5)
Glucose: 113 mg/dL — ABNORMAL HIGH (ref 70–99)
Potassium: 4.6 mmol/L (ref 3.5–5.2)
Sodium: 140 mmol/L (ref 134–144)
Total Protein: 6.4 g/dL (ref 6.0–8.5)
eGFR: 82 mL/min/{1.73_m2} (ref >59)

## 2022-05-16 LAB — LIPID PANEL
Chol/HDL Ratio: 2.9 ratio (ref 0.0–5.0)
Cholesterol, Total: 153 mg/dL (ref 100–199)
HDL: 53 mg/dL (ref >39)
LDL Chol Calc (NIH): 80 mg/dL (ref 0–99)
Triglycerides: 114 mg/dL (ref 0–149)
VLDL Cholesterol Cal: 20 mg/dL (ref 5–40)

## 2022-05-16 LAB — TSH: TSH: 1.57 u[IU]/mL (ref 0.450–4.500)

## 2022-05-16 LAB — PSA: Prostate Specific Ag, Serum: 0.6 ng/mL (ref 0.0–4.0)

## 2022-07-10 ENCOUNTER — Ambulatory Visit (INDEPENDENT_AMBULATORY_CARE_PROVIDER_SITE_OTHER): Payer: PRIVATE HEALTH INSURANCE | Admitting: Physician Assistant

## 2022-07-10 DIAGNOSIS — Z23 Encounter for immunization: Secondary | ICD-10-CM | POA: Diagnosis not present

## 2023-08-10 ENCOUNTER — Ambulatory Visit: Payer: PRIVATE HEALTH INSURANCE

## 2023-08-10 DIAGNOSIS — D122 Benign neoplasm of ascending colon: Secondary | ICD-10-CM | POA: Diagnosis not present

## 2023-08-10 DIAGNOSIS — K635 Polyp of colon: Secondary | ICD-10-CM | POA: Diagnosis not present

## 2023-08-10 DIAGNOSIS — Z09 Encounter for follow-up examination after completed treatment for conditions other than malignant neoplasm: Secondary | ICD-10-CM | POA: Diagnosis present

## 2023-08-10 DIAGNOSIS — K5730 Diverticulosis of large intestine without perforation or abscess without bleeding: Secondary | ICD-10-CM | POA: Diagnosis not present

## 2023-08-10 DIAGNOSIS — Z86010 Personal history of colonic polyps: Secondary | ICD-10-CM | POA: Diagnosis not present
